# Patient Record
Sex: Female | Born: 2005 | Hispanic: No | Marital: Single
Health system: Southern US, Community
[De-identification: ages and names within clinical notes are randomized; demographics above are authoritative.]

## PROBLEM LIST (undated history)

## (undated) DIAGNOSIS — D649 Anemia, unspecified: Secondary | ICD-10-CM

## (undated) DIAGNOSIS — N12 Tubulo-interstitial nephritis, not specified as acute or chronic: Secondary | ICD-10-CM

## (undated) DIAGNOSIS — D369 Benign neoplasm, unspecified site: Secondary | ICD-10-CM

## (undated) DIAGNOSIS — N390 Urinary tract infection, site not specified: Secondary | ICD-10-CM

## (undated) DIAGNOSIS — T50902A Poisoning by unspecified drugs, medicaments and biological substances, intentional self-harm, initial encounter: Secondary | ICD-10-CM

## (undated) DIAGNOSIS — F319 Bipolar disorder, unspecified: Secondary | ICD-10-CM

## (undated) DIAGNOSIS — F32A Depression, unspecified: Secondary | ICD-10-CM

## (undated) DIAGNOSIS — F329 Major depressive disorder, single episode, unspecified: Secondary | ICD-10-CM

## (undated) HISTORY — DX: Benign neoplasm, unspecified site: D36.9

## (undated) HISTORY — DX: Tubulo-interstitial nephritis, not specified as acute or chronic: N12

## (undated) HISTORY — DX: Anemia, unspecified: D64.9

---
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
## 2023-03-04 HISTORY — PX: OTHER SURGICAL HISTORY: SHX169

## 2024-01-02 ENCOUNTER — Other Ambulatory Visit: Payer: Self-pay | Admitting: Adult Health

## 2024-01-02 DIAGNOSIS — Z3490 Encounter for supervision of normal pregnancy, unspecified, unspecified trimester: Secondary | ICD-10-CM

## 2024-01-02 DIAGNOSIS — O4690 Antepartum hemorrhage, unspecified, unspecified trimester: Secondary | ICD-10-CM

## 2024-01-07 LAB — BETA HCG QUANT (REF LAB): hCG Quant: 145481 m[IU]/mL

## 2024-01-09 ENCOUNTER — Ambulatory Visit: Payer: Self-pay | Admitting: Adult Health

## 2024-01-09 ENCOUNTER — Telehealth: Payer: Self-pay

## 2024-01-09 NOTE — Telephone Encounter (Signed)
 Pt aware blood test is saying she is about [redacted] weeks pregnant and needs a dating US . Pt states she went to [facility] over the weekend and tested positive for CHL. Pt was advised if she don't hear from them in the next couple of days, to let us  know. Pt voiced understanding. Call transferred to [facility] for appt. JSY

## 2024-01-09 NOTE — Telephone Encounter (Signed)
-----   Message from Delon Lewis sent at 01/09/2024  8:44 AM EDT ----- She needs dating US  ASAP

## 2024-01-09 NOTE — Telephone Encounter (Signed)
 Returned patient's call. States she was advised to take Keflex  for UTI but in the past that has not worked for her. Requesting injection for treatment.  Informed patient since we have never seen her in our office before, she will need to see a provider. Appt made for tomorrow at 3:30. Pt verbalized understanding with no further questions.

## 2024-01-09 NOTE — Telephone Encounter (Signed)
 Patient is early pregnant and is wanting to know if Kephlex is safe during pregnancy. Please advise.

## 2024-01-10 ENCOUNTER — Other Ambulatory Visit: Payer: Self-pay | Admitting: Obstetrics & Gynecology

## 2024-01-10 ENCOUNTER — Ambulatory Visit: Admitting: Obstetrics & Gynecology

## 2024-01-10 ENCOUNTER — Encounter: Payer: Self-pay | Admitting: Obstetrics & Gynecology

## 2024-01-10 VITALS — BP 120/75 | HR 78 | Ht 62.0 in | Wt 152.0 lb

## 2024-01-10 DIAGNOSIS — O219 Vomiting of pregnancy, unspecified: Secondary | ICD-10-CM | POA: Diagnosis not present

## 2024-01-10 DIAGNOSIS — Z3A09 9 weeks gestation of pregnancy: Secondary | ICD-10-CM

## 2024-01-10 DIAGNOSIS — O2341 Unspecified infection of urinary tract in pregnancy, first trimester: Secondary | ICD-10-CM | POA: Diagnosis not present

## 2024-01-10 DIAGNOSIS — O364XX0 Maternal care for intrauterine death, not applicable or unspecified: Secondary | ICD-10-CM | POA: Diagnosis not present

## 2024-01-10 DIAGNOSIS — N3000 Acute cystitis without hematuria: Secondary | ICD-10-CM

## 2024-01-10 DIAGNOSIS — Z3490 Encounter for supervision of normal pregnancy, unspecified, unspecified trimester: Secondary | ICD-10-CM

## 2024-01-10 DIAGNOSIS — O3680X0 Pregnancy with inconclusive fetal viability, not applicable or unspecified: Secondary | ICD-10-CM

## 2024-01-10 MED ORDER — AZITHROMYCIN 500 MG PO TABS: 1000.0000 mg | ORAL_TABLET | Freq: Once | ORAL | 0 refills | Status: AC

## 2024-01-10 MED ORDER — CEFTRIAXONE SODIUM 1 G IJ SOLR
1.0000 g | Freq: Once | INTRAMUSCULAR | Status: AC
Start: 2024-01-10 — End: 2024-01-10
  Administered 2024-01-10: 1 g via INTRAMUSCULAR

## 2024-01-10 NOTE — Progress Notes (Signed)
 Chief Complaint  Patient presents with   Urinary Tract Infection      18 y.o. [id number] No LMP recorded. Patient is pregnant. The current method of family planning is pregnanct.  Outpatient Encounter Medications as of 01/10/2024  Medication Sig   azithromycin (ZITHROMAX) 500 MG tablet Take 2 tablets (1,000 mg total) by mouth once for 1 dose.   [DISCONTINUED] FLUoxetine  (PROZAC ) 20 MG capsule Take 1 capsule (20 mg total) by mouth at bedtime.   [DISCONTINUED] fluticasone  (FLONASE ) 50 MCG/ACT nasal spray Place 2 sprays into both nostrils daily. (Patient taking differently: Place 2 sprays into both nostrils daily as needed for allergies. )   [DISCONTINUED] hydrOXYzine  (ATARAX /VISTARIL ) 50 MG tablet Take 1 tablet (50 mg total) by mouth at bedtime.   [DISCONTINUED] QUEtiapine  (SEROQUEL  XR) 50 MG TB24 24 hr tablet Take 2 tablets (100 mg total) by mouth at bedtime.   [EXPIRED] cefTRIAXone (ROCEPHIN) injection 1 g    No facility-administered encounter medications on file as of 01/10/2024.    Subjective Pt here for untreated UTI, I early pregnant has NOB scheduled in the future Also with untreated chlamydia infection   Past Medical History:  Diagnosis Date   Bipolar 1 disorder (HCC)    Depression    Dermoid cyst    Suicidal overdose (HCC)    UTI (urinary tract infection)     Past Surgical History:  Procedure Laterality Date   Left LAPAROSCOPY WITH FULGURATION OR EXCISION OF LESIONS OF THE OVARY, PELVIC VISCERA, OR PERITONEAL SURFACE Left 03/04/2023    OB History     Gravida  3   Para  1   Term      Preterm  1   AB  1   Living         SAB  1   IAB      Ectopic      Multiple      Live Births              Allergies  Allergen Reactions   [name]  [Lamotrigine ] Swelling   Bactrim [Sulfamethoxazole-Trimethoprim] Nausea And Vomiting   [name]  [Lurasidone  Hcl] Swelling    Of lips and face. Mom suspects this drug and declines using it.     Social  History   Socioeconomic History   Marital status: Single    Spouse name: Not on file   Number of children: Not on file   Years of education: Not on file   Highest education level: Not on file  Occupational History   Not on file  Tobacco Use   Smoking status: Passive Smoke Exposure - Never Smoker   Smokeless tobacco: Never  Vaping Use   Vaping status: Never Used  Substance and Sexual Activity   Alcohol use: No   Drug use: Yes    Types: Marijuana   Sexual activity: Yes    Birth control/protection: None    Comment: states she is currently sexually active with a female   Other Topics Concern   Not on file  Social History Narrative   Lives with Mom, step dad, younger brother, 1 cat   Social Drivers of Corporate Investment Banker Strain: Low Risk  (09/24/2023)   Received from [facility]   Overall Financial Resource Strain (CARDIA)    How hard is it for you to pay for the very basics like food, housing, medical care, and heating?: Not hard at all  Food Insecurity: No Food Insecurity (09/24/2023)  Received from [facility]   Hunger Vital Sign    Within the past 12 months, you worried that your food would run out before you got the money to buy more.: Never true    Within the past 12 months, the food you bought just didn't last and you didn't have money to get more.: Never true  Transportation Needs: No Transportation Needs (09/24/2023)   Received from [facility] - Transportation    Lack of Transportation (Medical): No    Lack of Transportation (Non-Medical): No  Physical Activity: Sufficiently Active (09/24/2023)   Received from [facility]   Exercise Vital Sign    On average, how many days per week do you engage in moderate to strenuous exercise (like a brisk walk)?: 4 days    On average, how many minutes do you engage in exercise at this level?: 120 min  Stress: Stress Concern Present (09/24/2023)   Received from [facility] - Occupational Stress Questionnaire    Do you feel stress - tense, restless, nervous, or anxious, or unable to sleep at night because your mind is troubled all the time - these days?: To some extent  Social Connections: Moderately Isolated (09/24/2023)   Received from [facility]   Social Connection and Isolation Panel    In a typical week, how many times do you talk on the phone with family, friends, or neighbors?: More than three times a week    How often do you get together with friends or relatives?: Once a week    How often do you attend church or religious services?: Never    Do you belong to any clubs or organizations such as church groups, unions, fraternal or athletic groups, or school groups?: No    How often do you attend meetings of the clubs or organizations you belong to?: Never    Are you married, widowed, divorced, separated, never married, or living with a partner?: Living with partner    History reviewed. No pertinent family history.  Medications:       Current Outpatient Medications:    azithromycin (ZITHROMAX) 500 MG tablet, Take 2 tablets (1,000 mg total) by mouth once for 1 dose., Disp: 2 tablet, Rfl: 0  Objective Blood pressure 120/75, pulse 78, height 5' 2 (1.575 m), weight 152 lb (68.9 kg).  IM rocephin 1 gram today Rx for 1 gram azithromycin  Pertinent ROS   Labs or studies     Impression + Management Plan: Diagnoses this Encounter::   ICD-10-CM   1. Early stage of pregnancy  Z34.90     2. IUFD at 20 weeks or more of gestation  O72.4XX0     3. Acute cystitis without hematuria  N30.00     4. Nausea/vomiting in pregnancy  O21.9         Medications prescribed during  this encounter: Meds ordered this encounter  Medications   cefTRIAXone (ROCEPHIN) injection 1 g    Antibiotic Indication::   UTI   azithromycin (ZITHROMAX) 500 MG tablet    Sig: Take 2 tablets (1,000 mg total) by mouth once for 1 dose.    Dispense:  2  tablet    Refill:  0    Labs or Scans Ordered during this encounter: No orders of the defined types were placed in this encounter.     Follow up Return in about 3 weeks (around 01/31/2024) for NOB appt.

## 2024-01-11 ENCOUNTER — Other Ambulatory Visit

## 2024-01-25 ENCOUNTER — Telehealth: Payer: Self-pay

## 2024-01-25 ENCOUNTER — Other Ambulatory Visit: Payer: Self-pay | Admitting: Women's Health

## 2024-01-25 DIAGNOSIS — A749 Chlamydial infection, unspecified: Secondary | ICD-10-CM | POA: Insufficient documentation

## 2024-01-25 MED ORDER — PROMETHAZINE HCL 25 MG PO TABS: 25.0000 mg | ORAL_TABLET | Freq: Four times a day (QID) | ORAL | 6 refills | Status: AC | PRN

## 2024-01-25 MED ORDER — AZITHROMYCIN 500 MG PO TABS: 1000.0000 mg | ORAL_TABLET | Freq: Once | ORAL | 0 refills | Status: AC

## 2024-01-25 NOTE — Telephone Encounter (Signed)
 Patient called regarding treatment for Chlamydia. Patient was seen in our office on 01/10/24 and was prescribed treatment for Chlamydia infection. Patient took the one dose medication and vomited it up not 30 mins after. Rn spoke with midwife and new prescription for treatment as well as nausea meds will be sent in for patient. Patient to take nausea meds and eat prior to taking treatment. Patient verbalized understanding.

## 2024-01-31 DIAGNOSIS — Z3490 Encounter for supervision of normal pregnancy, unspecified, unspecified trimester: Secondary | ICD-10-CM | POA: Insufficient documentation

## 2024-01-31 DIAGNOSIS — O0990 Supervision of high risk pregnancy, unspecified, unspecified trimester: Secondary | ICD-10-CM | POA: Insufficient documentation

## 2024-02-01 ENCOUNTER — Other Ambulatory Visit (HOSPITAL_COMMUNITY)
Admission: RE | Admit: 2024-02-01 | Discharge: 2024-02-01 | Disposition: A | Discharge status: Home / Self Care | Source: Ambulatory Visit | Attending: Women's Health | Admitting: Women's Health

## 2024-02-01 ENCOUNTER — Ambulatory Visit: Admitting: *Deleted

## 2024-02-01 ENCOUNTER — Encounter: Payer: Self-pay | Admitting: Women's Health

## 2024-02-01 ENCOUNTER — Ambulatory Visit (INDEPENDENT_AMBULATORY_CARE_PROVIDER_SITE_OTHER): Admitting: Women's Health

## 2024-02-01 VITALS — BP 111/73 | HR 72 | Wt 148.0 lb

## 2024-02-01 DIAGNOSIS — O98311 Other infections with a predominantly sexual mode of transmission complicating pregnancy, first trimester: Secondary | ICD-10-CM

## 2024-02-01 DIAGNOSIS — O2341 Unspecified infection of urinary tract in pregnancy, first trimester: Secondary | ICD-10-CM | POA: Diagnosis not present

## 2024-02-01 DIAGNOSIS — Z3A00 Weeks of gestation of pregnancy not specified: Secondary | ICD-10-CM | POA: Diagnosis not present

## 2024-02-01 DIAGNOSIS — O209 Hemorrhage in early pregnancy, unspecified: Secondary | ICD-10-CM | POA: Insufficient documentation

## 2024-02-01 DIAGNOSIS — Z8759 Personal history of other complications of pregnancy, childbirth and the puerperium: Secondary | ICD-10-CM

## 2024-02-01 DIAGNOSIS — A749 Chlamydial infection, unspecified: Secondary | ICD-10-CM

## 2024-02-01 DIAGNOSIS — O09299 Supervision of pregnancy with other poor reproductive or obstetric history, unspecified trimester: Secondary | ICD-10-CM | POA: Insufficient documentation

## 2024-02-01 DIAGNOSIS — Z1332 Encounter for screening for maternal depression: Secondary | ICD-10-CM | POA: Diagnosis not present

## 2024-02-01 DIAGNOSIS — Z3481 Encounter for supervision of other normal pregnancy, first trimester: Secondary | ICD-10-CM

## 2024-02-01 DIAGNOSIS — Z3480 Encounter for supervision of other normal pregnancy, unspecified trimester: Secondary | ICD-10-CM

## 2024-02-01 DIAGNOSIS — Z87440 Personal history of urinary (tract) infections: Secondary | ICD-10-CM

## 2024-02-01 DIAGNOSIS — O364XX0 Maternal care for intrauterine death, not applicable or unspecified: Secondary | ICD-10-CM

## 2024-02-01 DIAGNOSIS — Z720 Tobacco use: Secondary | ICD-10-CM

## 2024-02-01 DIAGNOSIS — Z3A12 12 weeks gestation of pregnancy: Secondary | ICD-10-CM

## 2024-02-01 DIAGNOSIS — Z131 Encounter for screening for diabetes mellitus: Secondary | ICD-10-CM

## 2024-02-01 DIAGNOSIS — O3441 Maternal care for other abnormalities of cervix, first trimester: Secondary | ICD-10-CM

## 2024-02-01 DIAGNOSIS — N841 Polyp of cervix uteri: Secondary | ICD-10-CM

## 2024-02-01 DIAGNOSIS — Z6829 Body mass index (BMI) 29.0-29.9, adult: Secondary | ICD-10-CM

## 2024-02-01 DIAGNOSIS — Z8632 Personal history of gestational diabetes: Secondary | ICD-10-CM

## 2024-02-01 MED ORDER — DOXYLAMINE-PYRIDOXINE 10-10 MG PO TBEC: DELAYED_RELEASE_TABLET | ORAL | 6 refills | Status: AC

## 2024-02-01 NOTE — Progress Notes (Signed)
 INITIAL OBSTETRICAL VISIT Patient name: Michelle Prince MRN [id number]  Date of birth: 2005-11-07 Chief Complaint:   Initial Prenatal Visit  History of Present Illness:   Michelle Prince is a 18 y.o. G71P0110 African-American female at [redacted]w[redacted]d by US  at 8 weeks with an Estimated Date of Delivery: 08/13/24 being seen today for her initial obstetrical visit.   No LMP recorded. Patient is pregnant. Her obstetrical history is significant for 36.3wk SVB/IUFD (short cord, A1DM, prominent intrahepatic vein seen on u/s, oligo, pyelo/sepsis earlier in pregnancy); then SAB x 1.   Recent UTI s/p IM Rocephin, no sx Recent +CT, took meds last week Nausea/vomiting, phenergan isn't helping much Almost daily vaginal bleeding, bright red. Denies abnormal discharge, itching/odor/irritation.   Last pap <21yo. Results were: N/A     02/01/2024    2:30 PM  Depression screen PHQ 2/9  Decreased Interest 1  Down, Depressed, Hopeless 1  PHQ - 2 Score 2  Altered sleeping 1  Tired, decreased energy 1  Change in appetite 1  Feeling bad or failure about yourself  1  Trouble concentrating 1  Moving slowly or fidgety/restless 0  Suicidal thoughts 0  PHQ-9 Score 7        02/01/2024    2:30 PM  GAD 7 : Generalized Anxiety Score  Nervous, Anxious, on Edge 2  Control/stop worrying 1  Worry too much - different things 2  Trouble relaxing 1  Restless 0  Easily annoyed or irritable 1  Afraid - awful might happen 2  Total GAD 7 Score 9     Review of Systems:   Pertinent items are noted in HPI Denies cramping/contractions, leakage of fluid, vaginal bleeding, abnormal vaginal discharge w/ itching/odor/irritation, headaches, visual changes, shortness of breath, chest pain, abdominal pain, severe nausea/vomiting, or problems with urination or bowel movements unless otherwise stated above.  Pertinent History Reviewed:  Reviewed past medical,surgical, social, obstetrical and family history.  Reviewed problem  list, medications and allergies. OB History  Gravida Para Term Preterm AB Living  3 1  1 1    SAB IAB Ectopic Multiple Live Births  1        # Outcome Date GA Lbr Len/2nd Weight Sex Type Anes PTL Lv  3 Current           2 SAB 02/2023          1 Preterm 10/11/22 [redacted]w[redacted]d / 00:14 5 lb 10.3 oz (2.56 kg) M Vag-Spont EPI N FD   Physical Assessment:   Vitals:   02/01/24 1418  BP: 111/73  Pulse: 72  Weight: 148 lb (67.1 kg)  Body mass index is 27.07 kg/m.       Physical Examination:  General appearance - well appearing, and in no distress  Mental status - alert, oriented to person, place, and time  Psych:  She has a normal mood and affect  Skin - warm and dry, normal color, no suspicious lesions noted  Chest - effort normal, all lung fields clear to auscultation bilaterally  Heart - normal rate and regular rhythm  Abdomen - soft, nontender  Extremities:  No swelling or varicosities noted  Pelvic - VULVA: normal appearing vulva with no masses, tenderness or lesions  VAGINA: normal appearing vagina with normal color, no lesions, creamy red d/c  CERVIX: 2 bleeding cervical polyps- monsels applied w/ good hemostasis, would remove today but no MD in office   Thin prep pap is not done   Chaperone: Winton Cherry  TODAY'S informal TA u/s: +FCA and active fetus  No results found for this or any previous visit (from the past 24 hours).  Assessment & Plan:  1) Low-Risk Pregnancy G3P0110 at [redacted]w[redacted]d with an Estimated Date of Delivery: 08/13/24   2) Initial OB visit  3) H/O 36wk IUFD> unclear etiology, short cord, A1DM, prominent intrahepatic vein seen on u/s, oligo, pyelo/sepsis earlier in pregnancy  4) H/O pyelo w/ sepsis, recent UTI> s/p rocephin IM, urine cx poc today, asymptomatic  5) H/O A1DM  6) Recent +CT> just finished meds last week  7) Vaginal bleeding> CV swab, expect CT to still be +  8) Cervical polyps> w/ bleeding, Monsels applied w/ good hemostasis. Did not remove today d/t no  MD in office in case of uncontrolled bleeding, will bring back for 1st available MD appt for removal. No sex. Reviewed warning s/s and reasons to seek care  9) THC use> advised complete cessation  10) Nausea> rx diclegis  11) Vapes> advised cessation  Meds: No orders of the defined types were placed in this encounter.   Initial labs obtained Continue prenatal vitamins Reviewed n/v relief measures and warning s/s to report Reviewed recommended weight gain based on pre-gravid BMI Encouraged well-balanced diet Genetic & carrier screening discussed: requests Panorama Ultrasound discussed; fetal survey: requested CCNC completed> form faxed if has or is planning to apply for medicaid The nature of [facility] [employer] with multiple MDs and other Advanced Practice Providers was explained to patient; also emphasized that fellows, residents, and students are part of our team. Does not have home bp cuff. Office bp cuff given: none available. Rx sent: no. Check bp weekly, let us  know if consistently >140/90.   Follow-up: Return in about 4 weeks (around 02/29/2024) for LROB, MD or CNM, in person; then @ 20w for anatomy u/s and LROB w/ CNM/MD.   Orders Placed This Encounter  Procedures   Urine Culture   CBC/D/Plt+RPR+Rh+ABO+RubIgG...   Hemoglobin A1c   PANORAMA PRENATAL TEST    Suzen JONELLE Fetters CNM, [facility] 02/01/2024 2:48 PM

## 2024-02-01 NOTE — Patient Instructions (Signed)
 Michelle Prince, thank you for choosing our office today! We appreciate the opportunity to meet your healthcare needs. You may receive a short survey by mail, e-mail, or through [employer]. If you are happy with your care we would appreciate if you could take just a few minutes to complete the survey questions. We read all of your comments and take your feedback very seriously. Thank you again for choosing our office.  Center for [employer] [facility] Team at [facility]  [facility] at [facility] ([address] [place], [place] [address]) Entrance C, located off of [address] Free 24/7 valet parking   Nausea & Vomiting Have saltine crackers or pretzels by your bed and eat a few bites before you raise your head out of bed in the morning Eat small frequent meals throughout the day instead of large meals Drink plenty of fluids throughout the day to stay hydrated, just don't drink a lot of fluids with your meals.  This can make your stomach fill up faster making you feel sick Do not brush your teeth right after you eat Products with real ginger are good for nausea, like ginger ale and ginger hard candy Make sure it says made with real ginger! Sucking on sour candy like lemon heads is also good for nausea If your prenatal vitamins make you nauseated, take them at night so you will sleep through the nausea Sea Bands If you feel like you need medicine for the nausea & vomiting please let us  know If you are unable to keep any fluids or food down please let us  know   Constipation Drink plenty of fluid, preferably water, throughout the day Eat foods high in fiber such as fruits, vegetables, and grains Exercise, such as walking, is a good way to keep your bowels regular Drink warm fluids, especially warm prune juice, or decaf coffee Eat a 1/2 cup of real oatmeal (not instant), 1/2 cup applesauce, and 1/2-1 cup warm prune juice every day If needed, you may take Colace (docusate sodium) stool softener  once or twice a day to help keep the stool soft.  If you still are having problems with constipation, you may take Miralax  once daily as needed to help keep your bowels regular.   Home Blood Pressure Monitoring for Patients   Your provider has recommended that you check your blood pressure (BP) at least once a week at home. If you do not have a blood pressure cuff at home, one will be provided for you. Contact your provider if you have not received your monitor within 1 week.   Helpful Tips for Accurate Home Blood Pressure Checks  Don't smoke, exercise, or drink caffeine 30 minutes before checking your BP Use the restroom before checking your BP (a full bladder can raise your pressure) Relax in a comfortable upright chair Feet on the ground Left arm resting comfortably on a flat surface at the level of your heart Legs uncrossed Back supported Sit quietly and don't talk Place the cuff on your bare arm Adjust snuggly, so that only two fingertips can fit between your skin and the top of the cuff Check 2 readings separated by at least one minute Keep a log of your BP readings For a visual, please reference this diagram: http://ccnc.care/bpdiagram  Provider Name: [facility]     Phone: [phone]  Zone 1: ALL CLEAR  Continue to monitor your symptoms:  BP reading is less than 140 (top number) or less than 90 (bottom  number)  No right upper stomach pain No headaches or seeing spots No feeling nauseated or throwing up No swelling in face and hands  Zone 2: CAUTION Call your doctor's office for any of the following:  BP reading is greater than 140 (top number) or greater than 90 (bottom number)  Stomach pain under your ribs in the middle or right side Headaches or seeing spots Feeling nauseated or throwing up Swelling in face and hands  Zone 3: EMERGENCY  Seek immediate medical care if you have any of the following:  BP reading is greater than160 (top number) or greater than  110 (bottom number) Severe headaches not improving with Tylenol  Serious difficulty catching your breath Any worsening symptoms from Zone 2    First Trimester of Pregnancy The first trimester of pregnancy is from week 1 until the end of week 12 (months 1 through 3). A week after a sperm fertilizes an egg, the egg will implant on the wall of the uterus. This embryo will begin to develop into a baby. Genes from you and your partner are forming the baby. The female genes determine whether the baby is a boy or a girl. At 6-8 weeks, the eyes and face are formed, and the heartbeat can be seen on ultrasound. At the end of 12 weeks, all the baby's organs are formed.  Now that you are pregnant, you will want to do everything you can to have a healthy baby. Two of the most important things are to get good prenatal care and to follow your health care provider's instructions. Prenatal care is all the medical care you receive before the baby's birth. This care will help prevent, find, and treat any problems during the pregnancy and childbirth. BODY CHANGES Your body goes through many changes during pregnancy. The changes vary from woman to woman.  You may gain or lose a couple of pounds at first. You may feel sick to your stomach (nauseous) and throw up (vomit). If the vomiting is uncontrollable, call your health care provider. You may tire easily. You may develop headaches that can be relieved by medicines approved by your health care provider. You may urinate more often. Painful urination may mean you have a bladder infection. You may develop heartburn as a result of your pregnancy. You may develop constipation because certain hormones are causing the muscles that push waste through your intestines to slow down. You may develop hemorrhoids or swollen, bulging veins (varicose veins). Your breasts may begin to grow larger and become tender. Your nipples may stick out more, and the tissue that surrounds them  (areola) may become darker. Your gums may bleed and may be sensitive to brushing and flossing. Dark spots or blotches (chloasma, mask of pregnancy) may develop on your face. This will likely fade after the baby is born. Your menstrual periods will stop. You may have a loss of appetite. You may develop cravings for certain kinds of food. You may have changes in your emotions from day to day, such as being excited to be pregnant or being concerned that something may go wrong with the pregnancy and baby. You may have more vivid and strange dreams. You may have changes in your hair. These can include thickening of your hair, rapid growth, and changes in texture. Some women also have hair loss during or after pregnancy, or hair that feels dry or thin. Your hair will most likely return to normal after your baby is born. WHAT TO EXPECT AT YOUR PRENATAL  VISITS During a routine prenatal visit: You will be weighed to make sure you and the baby are growing normally. Your blood pressure will be taken. Your abdomen will be measured to track your baby's growth. The fetal heartbeat will be listened to starting around week 10 or 12 of your pregnancy. Test results from any previous visits will be discussed. Your health care provider may ask you: How you are feeling. If you are feeling the baby move. If you have had any abnormal symptoms, such as leaking fluid, bleeding, severe headaches, or abdominal cramping. If you have any questions. Other tests that may be performed during your first trimester include: Blood tests to find your blood type and to check for the presence of any previous infections. They will also be used to check for low iron levels (anemia) and Rh antibodies. Later in the pregnancy, blood tests for diabetes will be done along with other tests if problems develop. Urine tests to check for infections, diabetes, or protein in the urine. An ultrasound to confirm the proper growth and development  of the baby. An amniocentesis to check for possible genetic problems. Fetal screens for spina bifida and Down syndrome. You may need other tests to make sure you and the baby are doing well. HOME CARE INSTRUCTIONS  Medicines Follow your health care provider's instructions regarding medicine use. Specific medicines may be either safe or unsafe to take during pregnancy. Take your prenatal vitamins as directed. If you develop constipation, try taking a stool softener if your health care provider approves. Diet Eat regular, well-balanced meals. Choose a variety of foods, such as meat or vegetable-based protein, fish, milk and low-fat dairy products, vegetables, fruits, and whole grain breads and cereals. Your health care provider will help you determine the amount of weight gain that is right for you. Avoid raw meat and uncooked cheese. These carry germs that can cause birth defects in the baby. Eating four or five small meals rather than three large meals a day may help relieve nausea and vomiting. If you start to feel nauseous, eating a few soda crackers can be helpful. Drinking liquids between meals instead of during meals also seems to help nausea and vomiting. If you develop constipation, eat more high-fiber foods, such as fresh vegetables or fruit and whole grains. Drink enough fluids to keep your urine clear or pale yellow. Activity and Exercise Exercise only as directed by your health care provider. Exercising will help you: Control your weight. Stay in shape. Be prepared for labor and delivery. Experiencing pain or cramping in the lower abdomen or low back is a good sign that you should stop exercising. Check with your health care provider before continuing normal exercises. Try to avoid standing for long periods of time. Move your legs often if you must stand in one place for a long time. Avoid heavy lifting. Wear low-heeled shoes, and practice good posture. You may continue to have sex  unless your health care provider directs you otherwise. Relief of Pain or Discomfort Wear a good support bra for breast tenderness.   Take warm sitz baths to soothe any pain or discomfort caused by hemorrhoids. Use hemorrhoid cream if your health care provider approves.   Rest with your legs elevated if you have leg cramps or low back pain. If you develop varicose veins in your legs, wear support hose. Elevate your feet for 15 minutes, 3-4 times a day. Limit salt in your diet. Prenatal Care Schedule your prenatal visits by the  twelfth week of pregnancy. They are usually scheduled monthly at first, then more often in the last 2 months before delivery. Write down your questions. Take them to your prenatal visits. Keep all your prenatal visits as directed by your health care provider. Safety Wear your seat belt at all times when driving. Make a list of emergency phone numbers, including numbers for family, friends, the hospital, and police and fire departments. General Tips Ask your health care provider for a referral to a local prenatal education class. Begin classes no later than at the beginning of month 6 of your pregnancy. Ask for help if you have counseling or nutritional needs during pregnancy. Your health care provider can offer advice or refer you to specialists for help with various needs. Do not use hot tubs, steam rooms, or saunas. Do not douche or use tampons or scented sanitary pads. Do not cross your legs for long periods of time. Avoid cat litter boxes and soil used by cats. These carry germs that can cause birth defects in the baby and possibly loss of the fetus by miscarriage or stillbirth. Avoid all smoking, herbs, alcohol, and medicines not prescribed by your health care provider. Chemicals in these affect the formation and growth of the baby. Schedule a dentist appointment. At home, brush your teeth with a soft toothbrush and be gentle when you floss. SEEK MEDICAL CARE IF:   You have dizziness. You have mild pelvic cramps, pelvic pressure, or nagging pain in the abdominal area. You have persistent nausea, vomiting, or diarrhea. You have a bad smelling vaginal discharge. You have pain with urination. You notice increased swelling in your face, hands, legs, or ankles. SEEK IMMEDIATE MEDICAL CARE IF:  You have a fever. You are leaking fluid from your vagina. You have spotting or bleeding from your vagina. You have severe abdominal cramping or pain. You have rapid weight gain or loss. You vomit blood or material that looks like coffee grounds. You are exposed to German measles and have never had them. You are exposed to fifth disease or chickenpox. You develop a severe headache. You have shortness of breath. You have any kind of trauma, such as from a fall or a car accident. Document Released: 03/02/2001 Document Revised: 07/23/2013 Document Reviewed: 01/16/2013 [facility] Patient Information 2015 [place], [place]. This information is not intended to replace advice given to you by your health care provider. Make sure you discuss any questions you have with your health care provider.

## 2024-02-02 ENCOUNTER — Encounter: Payer: Self-pay | Admitting: Women's Health

## 2024-02-02 DIAGNOSIS — Z3490 Encounter for supervision of normal pregnancy, unspecified, unspecified trimester: Secondary | ICD-10-CM | POA: Insufficient documentation

## 2024-02-02 LAB — CBC/D/PLT+RPR+RH+ABO+RUBIGG...
Antibody Screen: NEGATIVE
Basophils Absolute: 0 x10E3/uL (ref 0.0–0.2)
Basos: 0 %
EOS (ABSOLUTE): 0 x10E3/uL (ref 0.0–0.4)
Eos: 0 %
HCV Ab: NONREACTIVE
HIV Screen 4th Generation wRfx: NONREACTIVE
Hematocrit: 38.8 % (ref 34.0–46.6)
Hemoglobin: 13 g/dL (ref 11.1–15.9)
Hepatitis B Surface Ag: NEGATIVE
Immature Grans (Abs): 0 x10E3/uL (ref 0.0–0.1)
Immature Granulocytes: 0 %
Lymphocytes Absolute: 1.1 x10E3/uL (ref 0.7–3.1)
Lymphs: 12 %
MCH: 30.7 pg (ref 26.6–33.0)
MCHC: 33.5 g/dL (ref 31.5–35.7)
MCV: 92 fL (ref 79–97)
Monocytes Absolute: 0.4 x10E3/uL (ref 0.1–0.9)
Monocytes: 4 %
Neutrophils Absolute: 7.7 x10E3/uL — ABNORMAL HIGH (ref 1.4–7.0)
Neutrophils: 84 %
Platelets: 354 x10E3/uL (ref 150–450)
RBC: 4.24 x10E6/uL (ref 3.77–5.28)
RDW: 13.9 % (ref 11.7–15.4)
RPR Ser Ql: NONREACTIVE
Rh Factor: POSITIVE
Rubella Antibodies, IGG: <0.9 {index} — ABNORMAL LOW (ref >0.99)
WBC: 9.3 x10E3/uL (ref 3.4–10.8)

## 2024-02-02 LAB — HEMOGLOBIN A1C
Est. average glucose Bld gHb Est-mCnc: 94 mg/dL
Hgb A1c MFr Bld: 4.9 % (ref 4.8–5.6)

## 2024-02-02 LAB — HCV INTERPRETATION

## 2024-02-03 LAB — CERVICOVAGINAL ANCILLARY ONLY
Bacterial Vaginitis (gardnerella): NEGATIVE
Candida Glabrata: NEGATIVE
Candida Vaginitis: NEGATIVE
Chlamydia: NEGATIVE
Comment: NEGATIVE
Comment: NEGATIVE
Comment: NEGATIVE
Comment: NEGATIVE
Comment: NEGATIVE
Comment: NORMAL
Neisseria Gonorrhea: NEGATIVE
Trichomonas: NEGATIVE

## 2024-02-03 LAB — URINE CULTURE

## 2024-02-06 ENCOUNTER — Ambulatory Visit: Payer: Self-pay | Admitting: Advanced Practice Midwife

## 2024-02-08 LAB — PANORAMA PRENATAL TEST FULL PANEL:PANORAMA TEST PLUS 5 ADDITIONAL MICRODELETIONS: FETAL FRACTION: 10.2

## 2024-02-23 ENCOUNTER — Encounter: Payer: Self-pay | Admitting: Obstetrics & Gynecology

## 2024-02-23 ENCOUNTER — Other Ambulatory Visit (HOSPITAL_COMMUNITY)
Admission: RE | Admit: 2024-02-23 | Discharge: 2024-02-23 | Disposition: A | Source: Ambulatory Visit | Attending: Obstetrics & Gynecology | Admitting: Obstetrics & Gynecology

## 2024-02-23 ENCOUNTER — Ambulatory Visit: Admitting: Obstetrics & Gynecology

## 2024-02-23 VITALS — BP 119/73 | HR 100 | Ht 63.0 in | Wt 140.4 lb

## 2024-02-23 DIAGNOSIS — Z8619 Personal history of other infectious and parasitic diseases: Secondary | ICD-10-CM | POA: Diagnosis present

## 2024-02-23 DIAGNOSIS — O4692 Antepartum hemorrhage, unspecified, second trimester: Secondary | ICD-10-CM | POA: Insufficient documentation

## 2024-02-23 DIAGNOSIS — N841 Polyp of cervix uteri: Secondary | ICD-10-CM

## 2024-02-23 DIAGNOSIS — Z3482 Encounter for supervision of other normal pregnancy, second trimester: Secondary | ICD-10-CM

## 2024-02-23 DIAGNOSIS — O219 Vomiting of pregnancy, unspecified: Secondary | ICD-10-CM

## 2024-02-23 DIAGNOSIS — F419 Anxiety disorder, unspecified: Secondary | ICD-10-CM

## 2024-02-23 DIAGNOSIS — F332 Major depressive disorder, recurrent severe without psychotic features: Secondary | ICD-10-CM

## 2024-02-23 LAB — CERVICOVAGINAL ANCILLARY ONLY
Chlamydia: NEGATIVE
Comment: NEGATIVE
Comment: NORMAL
Neisseria Gonorrhea: NEGATIVE

## 2024-02-23 MED ORDER — ESCITALOPRAM OXALATE 10 MG PO TABS: 10.0000 mg | ORAL_TABLET | Freq: Every day | ORAL | 4 refills | Status: AC

## 2024-02-23 MED ORDER — PRENATAL VITAMIN 27-0.8 MG PO TABS: 1.0000 | ORAL_TABLET | Freq: Every day | ORAL | 4 refills | Status: AC

## 2024-02-23 MED ORDER — HYDROXYZINE HCL 10 MG PO TABS: 10.0000 mg | ORAL_TABLET | Freq: Three times a day (TID) | ORAL | 0 refills | Status: DC | PRN

## 2024-02-23 NOTE — Progress Notes (Signed)
 HIGH-RISK PREGNANCY VISIT Patient name: Michelle Prince MRN [id number]  Date of birth: [birth year] Chief Complaint:   No chief complaint on file.  History of Present Illness:   Michelle Prince is a 18 y.o. G48P0110 female at [redacted]w[redacted]d with an Estimated Date of Delivery: 08/13/24 being seen today for ongoing management of a high-risk pregnancy complicated by   -Cervical polyp: Sometime pink to bright red, not wearing a pad.  On occasion may see some spotting, but only a small amount.  -Chlamdyia- both she and partner have been treated  -Notes concerns about mental health- Notes feeling sad, lack Feeling of hopelessness.  Concerns regarding anxiety/depression- previously taking lexapro, hydroxyzine , seroquel .    -Nausea/vomiting- has noted some weight loss, taking Phenergan  which is helping and feels like she is on a better regimen now  Denies vaginal bleeding, no yet feeling fetal movement.  No contractions.  Denies leaking of fluid.      02/01/2024    2:30 PM  Depression screen PHQ 2/9  Decreased Interest 1  Down, Depressed, Hopeless 1  PHQ - 2 Score 2  Altered sleeping 1  Tired, decreased energy 1  Change in appetite 1  Feeling bad or failure about yourself  1  Trouble concentrating 1  Moving slowly or fidgety/restless 0  Suicidal thoughts 0  PHQ-9 Score 7     Current Outpatient Medications  Medication Instructions   Doxylamine -Pyridoxine  (DICLEGIS ) 10-10 MG TBEC 2 tabs q hs, if sx persist add 1 tab q am on day 3, if sx persist add 1 tab q afternoon on day 4   escitalopram (LEXAPRO) 10 mg, Oral, Daily   hydrOXYzine  (ATARAX ) 10 mg, Oral, 3 times daily PRN   promethazine  (PHENERGAN ) 25 mg, Oral, Every 6 hours PRN     Review of Systems:   Pertinent items are noted in HPI Denies abnormal vaginal discharge w/ itching/odor/irritation, headaches, visual changes, shortness of breath, chest pain, abdominal pain, severe nausea/vomiting, or problems with urination or bowel movements  unless otherwise stated above. Pertinent History Reviewed:  Reviewed past medical,surgical, social, obstetrical and family history.  Reviewed problem list, medications and allergies. Physical Assessment:   Vitals:   02/23/24 1056  BP: 119/73  Pulse: 100  Weight: 140 lb 6.4 oz (63.7 kg)  Height: 5' 3 (1.6 m)  Body mass index is 24.87 kg/m.           Physical Examination:   General appearance: alert, well appearing, and in no distress  Mental status: normal mood, behavior, speech, dress, motor activity, and thought processes  Skin: warm & dry   Extremities:      Cardiovascular: normal heart rate noted  Respiratory: normal respiratory effort, no distress  Abdomen: gravid, soft, non-tender  Pelvic: normal external genitalia, vaginal pink mucosa,  no abnormal discharge or lesions.  Cervix appears friable and polypoid-like- no bleeding noted.          Fetal Status:        150bpm by doppler  Fetal Surveillance Testing today: doppler   Chaperone: Alan Fischer    No results found for this or any previous visit (from the past 24 hours).   Assessment & Plan:  High-risk pregnancy: G3P0110 at [redacted]w[redacted]d with an Estimated Date of Delivery: 08/13/24   1) Cervical polyp/vaginal spotting - Reassured patient that spotting is likely due to cervix.  Discussed that light spotting may continue throughout pregnancy - No intervention indicated at this time - Should she note worsening bleeding would advise  patient call but otherwise we will continue monitoring  2) Anxiety and depression - Plan to restart Lexapro and hydroxyzine  - Will continue to monitor  3) history of chlamydia, POC collected today  4) nausea/vomiting - Plan to continue with Phenergan  - 8 pound weight loss since prior visit, will continue to monitor - Patient given precautions to call if worsening of symptoms or unable to keep anything down  5) Prenatal care - Patient does not have BP cuff and we did not have any in office  to give to her - Encouraged patient to try and get one on her own  Meds:  Meds ordered this encounter  Medications   escitalopram (LEXAPRO) 10 MG tablet    Sig: Take 1 tablet (10 mg total) by mouth daily.    Dispense:  90 tablet    Refill:  4   hydrOXYzine  (ATARAX ) 10 MG tablet    Sig: Take 1 tablet (10 mg total) by mouth 3 (three) times daily as needed for anxiety.    Dispense:  30 tablet    Refill:  0    Labs/procedures today: GC/C  Treatment Plan:  []  Anatomy scan and AFP at next visit  Reviewed: Preterm labor symptoms and general obstetric precautions including but not limited to vaginal bleeding, contractions, leaking of fluid and fetal movement were reviewed in detail with the patient.  All questions were answered.   Follow-up: Return for cancel 12/10, keep other appointment.   Future Appointments  Date Time Provider Department Center  03/27/2024 10:00 AM [facility]  [facility] None  03/27/2024 10:50 AM Kizzie Suzen SAUNDERS, CNM [facility]    No orders of the defined types were placed in this encounter.   Jamarques Pinedo, DO Attending Obstetrician & Gynecologist, [facility] for [employer], [facility]

## 2024-02-24 ENCOUNTER — Ambulatory Visit: Payer: Self-pay | Admitting: Obstetrics & Gynecology

## 2024-02-29 ENCOUNTER — Encounter: Admitting: Advanced Practice Midwife

## 2024-03-02 NOTE — H&P (Signed)
 HISTORY & PHYSICAL [facility]     Assessment & Plan   Michelle Prince is a 18 y.o. G2P0100 here with c/o leakage of fluid with concern this it is  not  urine  Principal Problem:   Hypokalemia Active Problems:   Leakage of amniotic fluid (HHS-HCC)   Plan:  Patient with confirmed hypokalemia and questionable UTI. Patient with history of UTIs in last pregnancy. Plan to admit for observation and will replace potassium  Dispo: Likely to discharge later today pending electrolyte replacement.     History   Chief Complaint:   Chief Complaint  Patient presents with   Abdominal Pain - Pregnancy    Leakage from pregnancy, no pain     HPI:  Michelle Prince is a 18 y.o. G2P0100 here with c/o leakage of fluid that she is concerned is not urine. She has a pregnancy complicated by fetal demise at 36 weeks and is very worried this pregnancy  PMH is complicated by:  Problem List[1]  The patient's records and other relevant notes/imaging/labs from the chart were reviewed: yes Interpreter Services: no   ROS ROS completed and is otherwise negative except as listed above in the HPI.   Past Medical History   Home Medications Medications Ordered Prior to Encounter[2]  Allergies Allergies[3]  OB History  Gravida Para Term Preterm AB Living  2 1  1     SAB IAB Ectopic Molar Multiple Live Births      0     # Outcome Date GA Lbr Len/2nd Weight Sex Type Anes PTL Lv  2 Current           1 Preterm 10/11/22 [redacted]w[redacted]d / 00:14 2560 g (5 lb 10.3 oz) M Vag-Spont IV Analgesic, EPI N FD    Past Medical History:  Diagnosis Date   Anxiety    Depression    Gestational diabetes (HHS-HCC)     Past Surgical History:  Procedure Laterality Date   OVARIAN CYST REMOVAL Left 03/04/2023    Social History   Substance and Sexual Activity  Alcohol Use Never   Social History   Tobacco Use  Smoking Status Some Days   Types: e-Cigarettes  Smokeless Tobacco Never     Family History  Problem Relation Age of Onset   Bipolar disorder Mother    Anxiety disorder Mother    No Known Problems Father    Asthma Brother     Physical Exam   BP 110/65   Pulse 69   Temp 35.7 C (96.3 F) (Temporal)   Resp 20   Ht 160 cm (5' 3)   Wt 64 kg (141 lb)   LMP 08/25/2023 (Exact Date)   SpO2 99%   BMI 24.98 kg/m   Physical Exam Gen: well developed and well- nourished, alert and oriented, no acute distress Skin: No lesions or rashes Head: NCAT Abd: Soft, nontender, nondistended, normal bowel sounds, good fetal heart tones of 150s Pelvic Exam: Deferred Extremitites: No edema Neuro: unremarkable; CN 2-12 grossly intact Psych: Normal affect   Results   Labs, imaging reviewed per chart.   Labs Recent Results (from the past 24 hours)  hCG QUANTitative, Blood   Collection Time: 03/01/24 11:28 PM  Result Value Ref Range   hCG Quantitative 46,175.0 mIU/mL  Comprehensive Metabolic Panel   Collection Time: 03/01/24 11:28 PM  Result Value Ref Range   Sodium 138 135 - 145 mmol/L   Potassium 2.7 (L) 3.5 - 5.0 mmol/L   Chloride  104 98 - 107 mmol/L   CO2 25.4 21.0 - 32.0 mmol/L   Anion Gap 9 3 - 11 mmol/L   BUN 4 (L) 8 - 20 mg/dL   Creatinine 9.48 (L) 9.39 - 1.10 mg/dL   BUN/Creatinine Ratio 8    eGFR CKD-EPI (2021) Female >90 >=60 mL/min/1.2m2   Glucose 107 70 - 179 mg/dL   Calcium 8.9 8.5 - 89.8 mg/dL   Albumin 2.7 (L) 3.5 - 5.0 g/dL   Total Protein 6.9 6.0 - 8.0 g/dL   Total Bilirubin 0.3 0.3 - 1.2 mg/dL   AST 10 (L) 15 - 40 U/L   ALT 15 12 - 78 U/L   Alkaline Phosphatase 59 46 - 116 U/L  CBC w/ Differential   Collection Time: 03/01/24 11:28 PM  Result Value Ref Range   WBC 7.8 4.0 - 10.5 10*9/L   RBC 3.44 (L) 3.80 - 5.10 10*12/L   HGB 10.9 (L) 11.5 - 15.0 g/dL   HCT 70.1 (L) 65.9 - 55.9 %   MCV 86.6 80.0 - 98.0 fL   MCH 31.7 27.0 - 34.0 pg   MCHC 36.6 (H) 32.0 - 36.0 g/dL   RDW 85.7 88.4 - 85.4 %   MPV 9.2 7.4 - 10.4 fL    Platelet 287 140 - 415 10*9/L   Neutrophils % 64.1 %   Lymphocytes % 25.3 %   Monocytes % 9.0 %   Eosinophils % 0.9 %   Basophils % 0.4 %   Absolute Neutrophils 5.0 1.8 - 7.8 10*9/L   Absolute Lymphocytes 2.0 0.7 - 4.5 10*9/L   Absolute Monocytes 0.7 0.1 - 1.0 10*9/L   Absolute Eosinophils 0.1 0.0 - 0.4 10*9/L   Absolute Basophils 0.0 0.0 - 0.2 10*9/L  Type and Screen with Confirmation ABORh   Collection Time: 03/02/24 12:41 AM  Result Value Ref Range   Blood Type O POS    Antibody Screen NEG   Urinalysis with Microscopy with Culture Reflex   Collection Time: 03/02/24  2:07 AM  Result Value Ref Range   Color, UA Yellow    Clarity, UA Cloudy (A) Clear   Specific Gravity, UA 1.019 1.010 - 1.025   pH, UA 6.5 5.0 - 8.0   Leukocyte Esterase, UA Large (A) Negative   Nitrite, UA Negative Negative   Protein, UA 30 mg/dL (A) Negative   Glucose, UA Negative Negative, Trace   Ketones, UA Negative Negative   Urobilinogen, UA <2.0 mg/dL <7.9 mg/dL   Bilirubin, UA Negative Negative   Blood, UA Trace (A) Negative   RBC, UA 17 (H) 0 - 3 /HPF   WBC, UA 92 (H) 0 - 3 /HPF   Squam Epithel, UA 23 (H) 0 - 10 /HPF   Bacteria, UA Occasional (A) None Seen /HPF   WBC Clumps None Seen None Seen /HPF   Hyphal Yeast None Seen None Seen /HPF   Sperm, UA Present /HPF   Yeast, UA Moderate (A) None Seen /HPF  Vaginitis Molecular Panel   Collection Time: 03/02/24  2:13 AM   Specimen: Patient-collected Vaginal Swab  Result Value Ref Range   Bacterial Vaginitis Positive (A) Negative   Candida group NAAT Detected (A) Not Detected   Candida glabrata/Candida krusei NAAT Detected (A) Not Detected   Trichomonas NAAT Not Detected Not Detected       Electronically signed by:  Dionne P Galloway, MD         [1] Patient Active Problem List Diagnosis   BMI (body  mass index), pediatric, 5% to less than 85% for age   Sexually active at young age   Engages in vaping   Acute pyelonephritis    Hyperemesis of pregnancy (HHS-HCC)   Hypokalemia   Hyponatremia   Sepsis due to urinary tract infection (CMS-HCC)   Leakage of amniotic fluid (HHS-HCC)  [2] No current facility-administered medications on file prior to encounter.   Current Outpatient Medications on File Prior to Encounter  Medication Sig Dispense Refill   escitalopram  oxalate (LEXAPRO ) 10 MG tablet Take 1 tablet (10 mg total) by mouth daily. TAKE 1 TABLET (10 MG TOTAL) BY MOUTH DAILY.     hydrOXYzine  (ATARAX ) 10 MG tablet Take 1 tablet (10 mg total) by mouth in the morning.     PRENATAL VITAMIN 27 mg iron- 0.8 mg Tab tablet Take 1 tablet by mouth daily.     promethazine  (PHENERGAN ) 25 MG tablet Take 1 tablet (25 mg total) by mouth every six (6) hours as needed for nausea.     ferrous sulfate 325 (65 FE) MG EC tablet Take 1 tablet (325 mg total) by mouth Three (3) times a day with a meal. (Patient not taking: Reported on 10/29/2022)     ibuprofen  (MOTRIN ) 800 MG tablet Take 1 tablet (800 mg total) by mouth every eight (8) hours as needed. (Patient not taking: Reported on 10/29/2022) 30 tablet 0   multivitamin, prenatal, folic acid-iron, 29 mg iron- 1 mg Take 1 tablet by mouth daily. (Patient not taking: Reported on 10/29/2022) 30 tablet 6  [3] Allergies Allergen Reactions   Lamotrigine  Rash and Swelling   Lurasidone  Hcl Swelling    Of lips and face. Mom suspects this drug and declines using it.   Sulfa (Sulfonamide Antibiotics) Other (See Comments)    Unsure of reaction    Sulfamethoxazole    Trimethoprim    Bactrim [Sulfamethoxazole-Trimethoprim] Nausea And Vomiting   [name]  [Lurasidone ] Rash

## 2024-03-02 NOTE — Progress Notes (Addendum)
 ------------------------------------------------------------------------------- Summary: [facility] OBV -------------------------------------------------------------------------------  History and Physical Assessment/Plan:  Michelle Prince is a 18 y.o. G2P0100 at [redacted]w[redacted]d Estimated Date of Delivery: 08/13/24 who is being admitted for evaluation for leaking of fluid, hypokalemia management, and UTI management.  Principal Problem:   Hypokalemia Active Problems:   Leakage of amniotic fluid (HHS-HCC)   -UTI: Culture sent. Patient given Rocephin  IV in ER. Plan for Keflex  P.O. upon discharge -Cultures + for BV and yeast. Miconazole vaginal and Flagyl rx sent in -Hypokalemia: Given PO Kcl in ER which induced emesis. S/P 20meq IV x 1. Ordered additional 40meq IV and plan to recheck Potassium serum levels 2 hours after last dose has completed.  -Concern for LOF: SSE and amnisure negative. Limited US  ordered to evaluate fetal lie, placental position, and AFI for gestational age. History of Chlamydia in this pregnancy. G/C ordered and pending. Dispo: Stable. Plan to re-evaluate pending above meausres.  History of Present Illness:  Chief Complaint - leaking of fluid  Patient presented with complaint of leaking of fluid. Prior pregnancy resulted in IUFD and patient noted that there was no amniotic fluid noted on US  when IUFD was diagnosed. Notes that her underwear has been very wet and that she does not believe this is urine. Denies contractions, vaginal bleeding. Denies recent intercourse. Denies fevers, chills,nausea, chest pain shortness of breath.Had one episode of emesis after taking the potassium pills in the ER. Denies any other signs or symptoms.   Allergies  Lamotrigine , Lurasidone  hcl, Sulfa (sulfonamide antibiotics), Sulfamethoxazole, Trimethoprim, Bactrim [sulfamethoxazole-trimethoprim], and [name]  [lurasidone ]  Medications    Current Medications[1]   OB History  OB History   Gravida Para Term Preterm AB Living  2 1 0 1 0 0  SAB IAB Ectopic Molar Multiple Live Births  0 0 0 0 0 0    # Outcome Date GA Lbr Len/2nd Weight Sex Type Anes PTL Lv  2 Current           1 Preterm 10/11/22 [redacted]w[redacted]d / 00:14 2560 g (5 lb 10.3 oz) M Vag-Spont IV Analgesic, EPI N FD     Name: Michelle Prince DON Hedges     Apgar1: 0  Apgar5: 0      Past Medical History  Past Medical History[2]  Past Surgical History  Past Surgical History[3]  Past Social History:  Social History[4]   Family History  family history includes Anxiety disorder in her mother; Asthma in her brother; Bipolar disorder in her mother; No Known Problems in her father.   Review of Systems  A 12 system review of systems was negative except as noted in HPI   Vital Signs  Temp:  [35.7 C (96.3 F)] 35.7 C (96.3 F) Pulse:  [69-75] 75 Resp:  [16-20] 16 BP: (110-114)/(65-75) 114/75 MAP (mmHg):  [79] 79 SpO2:  [99 %-100 %] 100 % BMI (Calculated):  [24.98] 24.98 No intake/output data recorded.   Physical Exam   General Appearance No acute distress, well appearing and well nourished.  Eyes Pupils equal and round. Extraocular muscles intact and sclera clear.  ENT Well hydrated moist mucous membranes        Pulmonary Clear to auscultation bilaterally, without wheezes/crackles/rhonchi. Good air movement.  Cardiovascular Pulse normal rate, regularity and rhythm. S1 and S2 normal, 2/6 systolic murmur.  Abdomen Normoactive bowel sounds, abdomen soft, gravid, no fundal tenderness, no rebound or guarding.     Genitourinary normal external genitalia, vulva, vagina, cervix, uterus and adnexa. Thick green clumpy discharge noted in  the vaginal vault. No pooling. Negative leaking of fluid from cervix with valsalva.No bleeding.  SVE 0 , 30 , -3   Extremities No rash, lesions or petechiae. No bilateral cyanosis, clubbing.  Edema none  Skin Skin color, texture, turgor normal, no rash/lesions/breakdown   Neurologic Alert and oriented to person, place, and time. Cranial nerves II-XII grossly intact, normal gait. DTR 2+  Psychiatric Mood and affect within normal limits     Test Results  Lab results last 24 hours:  Recent Results (from the past 24 hours)  hCG QUANTitative, Blood   Collection Time: 03/01/24 11:28 PM  Result Value Ref Range   hCG Quantitative 46,175.0 mIU/mL  Comprehensive Metabolic Panel   Collection Time: 03/01/24 11:28 PM  Result Value Ref Range   Sodium 138 135 - 145 mmol/L   Potassium 2.7 (L) 3.5 - 5.0 mmol/L   Chloride 104 98 - 107 mmol/L   CO2 25.4 21.0 - 32.0 mmol/L   Anion Gap 9 3 - 11 mmol/L   BUN 4 (L) 8 - 20 mg/dL   Creatinine 9.48 (L) 9.39 - 1.10 mg/dL   BUN/Creatinine Ratio 8    eGFR CKD-EPI (2021) Female >90 >=60 mL/min/1.64m2   Glucose 107 70 - 179 mg/dL   Calcium 8.9 8.5 - 89.8 mg/dL   Albumin 2.7 (L) 3.5 - 5.0 g/dL   Total Protein 6.9 6.0 - 8.0 g/dL   Total Bilirubin 0.3 0.3 - 1.2 mg/dL   AST 10 (L) 15 - 40 U/L   ALT 15 12 - 78 U/L   Alkaline Phosphatase 59 46 - 116 U/L  CBC w/ Differential   Collection Time: 03/01/24 11:28 PM  Result Value Ref Range   WBC 7.8 4.0 - 10.5 10*9/L   RBC 3.44 (L) 3.80 - 5.10 10*12/L   HGB 10.9 (L) 11.5 - 15.0 g/dL   HCT 70.1 (L) 65.9 - 55.9 %   MCV 86.6 80.0 - 98.0 fL   MCH 31.7 27.0 - 34.0 pg   MCHC 36.6 (H) 32.0 - 36.0 g/dL   RDW 85.7 88.4 - 85.4 %   MPV 9.2 7.4 - 10.4 fL   Platelet 287 140 - 415 10*9/L   Neutrophils % 64.1 %   Lymphocytes % 25.3 %   Monocytes % 9.0 %   Eosinophils % 0.9 %   Basophils % 0.4 %   Absolute Neutrophils 5.0 1.8 - 7.8 10*9/L   Absolute Lymphocytes 2.0 0.7 - 4.5 10*9/L   Absolute Monocytes 0.7 0.1 - 1.0 10*9/L   Absolute Eosinophils 0.1 0.0 - 0.4 10*9/L   Absolute Basophils 0.0 0.0 - 0.2 10*9/L  Type and Screen with Confirmation ABORh   Collection Time: 03/02/24 12:41 AM  Result Value Ref Range   Blood Type O POS    Antibody Screen NEG   Urinalysis with Microscopy  with Culture Reflex   Collection Time: 03/02/24  2:07 AM  Result Value Ref Range   Color, UA Yellow    Clarity, UA Cloudy (A) Clear   Specific Gravity, UA 1.019 1.010 - 1.025   pH, UA 6.5 5.0 - 8.0   Leukocyte Esterase, UA Large (A) Negative   Nitrite, UA Negative Negative   Protein, UA 30 mg/dL (A) Negative   Glucose, UA Negative Negative, Trace   Ketones, UA Negative Negative   Urobilinogen, UA <2.0 mg/dL <7.9 mg/dL   Bilirubin, UA Negative Negative   Blood, UA Trace (A) Negative   RBC, UA 17 (H) 0 - 3 /HPF  WBC, UA 92 (H) 0 - 3 /HPF   Squam Epithel, UA 23 (H) 0 - 10 /HPF   Bacteria, UA Occasional (A) None Seen /HPF   WBC Clumps None Seen None Seen /HPF   Hyphal Yeast None Seen None Seen /HPF   Sperm, UA Present /HPF   Yeast, UA Moderate (A) None Seen /HPF  Vaginitis Molecular Panel   Collection Time: 03/02/24  2:13 AM   Specimen: Patient-collected Vaginal Swab  Result Value Ref Range   Bacterial Vaginitis Positive (A) Negative   Candida group NAAT Detected (A) Not Detected   Candida glabrata/Candida krusei NAAT Detected (A) Not Detected   Trichomonas NAAT Not Detected Not Detected  POCT ROM   Collection Time: 03/02/24  8:00 AM  Result Value Ref Range   POCT ROM      Negative: a negative result is indicative of the absence of amniotic fluid   Internal QC, POCT ROM QC Acceptable    Kit Lot Number, POCT ROM [id number]    Kit Expiration Date, POCT ROM 04/08/2026                [1] Current Facility-Administered Medications  Medication Dose Route Frequency Provider Last Rate Last Admin   potassium chloride  10 mEq in 100 mL IVPB  10 mEq Intravenous Q1H Wacks, Margot Alexandra, DO      [2] Past Medical History: Diagnosis Date   Anxiety    Depression    Gestational diabetes (HHS-HCC)   [3] Past Surgical History: Procedure Laterality Date   OVARIAN CYST REMOVAL Left 03/04/2023  [4] Social History Socioeconomic History   Marital status: Single  Tobacco  Use   Smoking status: Some Days    Types: e-Cigarettes   Smokeless tobacco: Never  Vaping Use   Vaping status: Every Day   Substances: Nicotine  Substance and Sexual Activity   Alcohol use: Never   Drug use: Never   Sexual activity: Yes    Partners: Male   Social Drivers of Health   Food Insecurity: No Food Insecurity (02/01/2024)   Received from [facility]   Hunger Vital Sign    Within the past 12 months, you worried that your food would run out before you got the money to buy more.: Never true    Within the past 12 months, the food you bought just didn't last and you didn't have money to get more.: Never true  Tobacco Use: Medium Risk (02/23/2024)   Received from [facility]   Patient History    Smoking Tobacco Use: Passive Smoke Exposure - Never Smoker    Smokeless Tobacco Use: Never    Passive Exposure: Yes  Transportation Needs: No Transportation Needs (02/01/2024)   Received from [facility] - Transportation    In the past 12 months, has lack of transportation kept you from medical appointments or from getting medications?: No    In the past 12 months, has lack of transportation kept you from meetings, work, or from getting things needed for daily living?: No  Alcohol Use: Not At Risk (12/14/2023)   Received from [facility]   AUDIT-C    Q1: How often do you have a drink containing alcohol?: 2-3 times a week    Q2: How many drinks containing alcohol do you have on a typical day when you are drinking?: 1 or 2    Q3: How often do you have six or more drinks on one occasion?: Never  Housing: Unknown (12/14/2023)  Received from [facility]   Housing Stability Vital Sign    At any time in the past 12 months, were you homeless or living in a shelter (including now)?: No  Physical Activity: Insufficiently Active (02/01/2024)   Received from [facility]   Exercise Vital Sign    On average, how many days per week  do you engage in moderate to strenuous exercise (like a brisk walk)?: 3 days    On average, how many minutes do you engage in exercise at this level?: 30 min  Utilities: Not At Risk (02/01/2024)   Received from [facility] Utilities    In the past 12 months has the electric, gas, oil, or water company threatened to shut off services in your home?: No  Stress: Stress Concern Present (02/01/2024)   Received from [facility] - Occupational Stress Questionnaire    Do you feel stress - tense, restless, nervous, or anxious, or unable to sleep at night because your mind is troubled all the time - these days?: Rather much  Interpersonal Safety: Not At Risk (09/24/2023)   Interpersonal Safety    Unsafe Where You Currently Live: No    Physically Hurt by Anyone: No    Abused by Anyone: No  Substance Use: Low Risk (09/24/2023)   Substance Use    In the past year, how often have you used prescription drugs for non-medical reasons?: Never    In the past year, how often have you used illegal drugs?: Never    In the past year, have you used any substance for non-medical reasons?: No  Social Connections: Socially Isolated (02/01/2024)   Received from [facility]   Social Connection and Isolation Panel    In a typical week, how many times do you talk on the phone with family, friends, or neighbors?: More than three times a week    How often do you get together with friends or relatives?: Once a week    How often do you attend church or religious services?: Never    Do you belong to any clubs or organizations such as church groups, unions, fraternal or athletic groups, or school groups?: No    How often do you attend meetings of the clubs or organizations you belong to?: Never    Are you married, widowed, divorced, separated, never married, or living with a partner?: Never married  Physicist, Medical Strain: Low Risk (02/01/2024)   Received from  [facility]   Overall Financial Resource Strain (CARDIA)    How hard is it for you to pay for the very basics like food, housing, medical care, and heating?: Not hard at all  Health Literacy: Adequate Health Literacy (02/01/2024)   Received from [facility]   B1300 Health Literacy    How often do you need to have someone help you when you read instructions, pamphlets, or other written material from your doctor or pharmacy?: Never  Internet Connectivity: No Internet connectivity concern identified (09/24/2023)   Internet Connectivity    Do you have access to internet services: Yes    How do you connect to the internet: Personal Device at home    Is your internet connection strong enough for you to watch video on your device without major problems?: Yes    Do you have enough data to get through the month?: Yes    Does at least one of the devices have a camera that you  can use for video chat?: Yes

## 2024-03-02 NOTE — Discharge Summary (Signed)
" °  Admit Date: 03/01/2024  EDD: 08/13/2024, by Ultrasound  Discharge Date and Time: 03/02/24  Discharge to: Home  Discharge Attending Physician: Albesa Lorane Sarah, DO   Gestational Age at Delivery: 16 weeks 4 days  Condition at Discharge: good  Immunizations:  Immunization History  Administered Date(s) Administered   DTaP / Hep B / IPV (Pediarix) 03/11/2004   DTaP, Unspecified Formulation 11/01/2003, 01/07/2004, 02/26/2005, 10/05/2007   Hepatitis A Vaccine - Unspecified Formulation 10/05/2007   Hepatitis A Vaccine Pediatric / Adolescent 2 Dose IM 08/27/2009   Hepatitis B Vaccine, Unspecified Formulation Jun 10, 2003, 09/30/2003, 07/10/2004   Hepatitis B vaccine, pediatric/adolescent dosage, Jul 17, 2003   HiB, unspecified 11/01/2003, 01/07/2004, 03/11/2004, 02/26/2005   Human Papillomavirus Vaccine,9-Valent(PF) 09/19/2014, 01/20/2016   INFLUENZA TIV (TRI) 33MO+ W/ PRESERV (IM) 05/11/2011, 06/23/2011   Influenza Vaccine Quad(IM)6 MO-Adult(PF) 01/05/2019   MENINGOCOCCAL VACCINE, A,C,Y, W-135(IM)(MENVEO) 12/17/2020   MMR 10/07/2004, 10/05/2007   Meningococcal Conjugate MCV4P 09/19/2014   Pneumococcal conjugate -PCV7 11/01/2003, 01/07/2004, 03/11/2004, 02/26/2005   Polio Virus Vaccine, Unspecified Formulation 11/01/2003, 01/07/2004, 07/10/2004, 10/05/2007   TdaP 09/19/2014, 08/05/2022   Varicella 10/07/2004, 10/05/2007    Discharge Medications:   Medication List    START taking these medications    cephalexin  500 MG capsule; Commonly known as: KEFLEX ; Take 1 capsule  (500 mg total) by mouth four (4) times a day for 10 days.  ferrous sulfate 325 (65 FE) MG EC tablet  metroNIDAZOLE 500 MG tablet; Commonly known as: FLAGYL; Take 1 tablet  (500 mg total) by mouth two (2) times a day for 7 days.  multivitamin, prenatal (folic acid-iron) 29 mg iron- 1 mg; Take 1 tablet  by mouth daily.   CONTINUE taking these medications    escitalopram  oxalate 10 MG  tablet; Commonly known as: LEXAPRO   hydrOXYzine  10 MG tablet; Commonly known as: ATARAX    STOP taking these medications    ibuprofen  800 MG tablet; Commonly known as: MOTRIN   PRENATAL VITAMIN 27 mg iron- 0.8 mg Tab tablet; Generic drug: prenatal  vit no.130-iron-folic  promethazine  25 MG tablet; Commonly known as: PHENERGAN     Discharge Instructions:         "

## 2024-03-02 NOTE — Nursing Note (Signed)
 In patients chart for telemetry purposes

## 2024-03-23 ENCOUNTER — Other Ambulatory Visit: Payer: Self-pay | Admitting: Obstetrics & Gynecology

## 2024-03-23 DIAGNOSIS — Z363 Encounter for antenatal screening for malformations: Secondary | ICD-10-CM

## 2024-03-26 DIAGNOSIS — Z3A20 20 weeks gestation of pregnancy: Secondary | ICD-10-CM

## 2024-03-26 DIAGNOSIS — Z8759 Personal history of other complications of pregnancy, childbirth and the puerperium: Secondary | ICD-10-CM

## 2024-03-26 DIAGNOSIS — Z363 Encounter for antenatal screening for malformations: Secondary | ICD-10-CM

## 2024-03-27 ENCOUNTER — Ambulatory Visit (INDEPENDENT_AMBULATORY_CARE_PROVIDER_SITE_OTHER)

## 2024-03-27 ENCOUNTER — Ambulatory Visit (INDEPENDENT_AMBULATORY_CARE_PROVIDER_SITE_OTHER): Admitting: Women's Health

## 2024-03-27 ENCOUNTER — Encounter: Payer: Self-pay | Admitting: Women's Health

## 2024-03-27 VITALS — BP 123/76 | HR 78 | Wt 141.8 lb

## 2024-03-27 DIAGNOSIS — Z3A20 20 weeks gestation of pregnancy: Secondary | ICD-10-CM | POA: Diagnosis not present

## 2024-03-27 DIAGNOSIS — O2343 Unspecified infection of urinary tract in pregnancy, third trimester: Secondary | ICD-10-CM | POA: Diagnosis not present

## 2024-03-27 DIAGNOSIS — F32A Depression, unspecified: Secondary | ICD-10-CM

## 2024-03-27 DIAGNOSIS — Z8759 Personal history of other complications of pregnancy, childbirth and the puerperium: Secondary | ICD-10-CM

## 2024-03-27 DIAGNOSIS — Z79899 Other long term (current) drug therapy: Secondary | ICD-10-CM | POA: Diagnosis not present

## 2024-03-27 DIAGNOSIS — Z3480 Encounter for supervision of other normal pregnancy, unspecified trimester: Secondary | ICD-10-CM

## 2024-03-27 DIAGNOSIS — O99282 Endocrine, nutritional and metabolic diseases complicating pregnancy, second trimester: Secondary | ICD-10-CM

## 2024-03-27 DIAGNOSIS — E876 Hypokalemia: Secondary | ICD-10-CM

## 2024-03-27 DIAGNOSIS — O99342 Other mental disorders complicating pregnancy, second trimester: Secondary | ICD-10-CM | POA: Diagnosis not present

## 2024-03-27 DIAGNOSIS — Z87440 Personal history of urinary (tract) infections: Secondary | ICD-10-CM

## 2024-03-27 DIAGNOSIS — O2342 Unspecified infection of urinary tract in pregnancy, second trimester: Secondary | ICD-10-CM

## 2024-03-27 DIAGNOSIS — F418 Other specified anxiety disorders: Secondary | ICD-10-CM

## 2024-03-27 DIAGNOSIS — Z1379 Encounter for other screening for genetic and chromosomal anomalies: Secondary | ICD-10-CM

## 2024-03-27 DIAGNOSIS — Z363 Encounter for antenatal screening for malformations: Secondary | ICD-10-CM | POA: Diagnosis not present

## 2024-03-27 DIAGNOSIS — O2340 Unspecified infection of urinary tract in pregnancy, unspecified trimester: Secondary | ICD-10-CM | POA: Insufficient documentation

## 2024-03-27 DIAGNOSIS — Z1389 Encounter for screening for other disorder: Secondary | ICD-10-CM

## 2024-03-27 DIAGNOSIS — O0990 Supervision of high risk pregnancy, unspecified, unspecified trimester: Secondary | ICD-10-CM

## 2024-03-27 DIAGNOSIS — Z331 Pregnant state, incidental: Secondary | ICD-10-CM

## 2024-03-27 DIAGNOSIS — O0992 Supervision of high risk pregnancy, unspecified, second trimester: Secondary | ICD-10-CM

## 2024-03-27 LAB — POCT URINALYSIS DIPSTICK OB
Glucose, UA: NEGATIVE
Ketones, UA: NEGATIVE

## 2024-03-27 MED ORDER — HYDROXYZINE HCL 10 MG PO TABS: 10.0000 mg | ORAL_TABLET | Freq: Every day | ORAL | 4 refills | Status: AC | PRN

## 2024-03-27 MED ORDER — NITROFURANTOIN MONOHYD MACRO 100 MG PO CAPS: 100.0000 mg | ORAL_CAPSULE | Freq: Two times a day (BID) | ORAL | 0 refills | Status: DC

## 2024-03-27 NOTE — Patient Instructions (Signed)
 Michelle Prince, thank you for choosing our office today! We appreciate the opportunity to meet your healthcare needs. You may receive a short survey by mail, e-mail, or through [employer]. If you are happy with your care we would appreciate if you could take just a few minutes to complete the survey questions. We read all of your comments and take your feedback very seriously. Thank you again for choosing our office.  Center for [employer] Team at [facility] at [facility] ([address] [place], [place] [address]) Entrance C, located off of [address] Free 24/7 valet parking  Go to [email or web address] to register for FREE online childbirth classes  Call the office [phone]) or go to [facility] if: You begin to severe cramping Your water breaks.  Sometimes it is a big gush of fluid, sometimes it is just a trickle that keeps getting your panties wet or running down your legs You have vaginal bleeding.  It is normal to have a small amount of spotting if your cervix was checked.   [facility] Pediatricians/Family Doctors [facility]): [address] Dr. Luba BROCKS, [phone]           [facility]: [address], [phone]                [facility] Medicine [facility]): [address], [phone] (call to ask if accepting patients) [facility] Department: [address], [place], [phone]    [facility] Pediatricians/Family Doctors [facility]): [phone] [address], [phone] [facility]: [address] [place], [phone] [facility]: [address], [phone]  [facility] Doctors  [facility]): [phone] [facility]: [address], [phone]   [facility] Doctors [facility]: [address] [phone]  [facility] Doctors  [facility]: [phone], [phone]  Home Blood Pressure Monitoring for Patients   Your provider has recommended that you check your blood pressure (BP) at least once a week at home. If you do not have a blood pressure cuff at home, one will be provided for you. Contact your provider if you have not received your monitor within 1 week.   Helpful Tips for Accurate Home Blood Pressure Checks  Don't smoke, exercise, or drink caffeine 30 minutes before checking your BP Use the restroom before checking your BP (a full bladder can raise your pressure) Relax in a comfortable upright chair Feet on the ground Left arm resting comfortably on a flat surface at the level of your heart Legs uncrossed Back supported Sit quietly and don't talk Place the cuff on your bare arm Adjust snuggly, so that only two fingertips can fit between your skin and the top of the cuff Check 2 readings separated by at least one minute Keep a log of your BP readings For a visual, please reference this diagram: http://ccnc.care/bpdiagram  Provider Name: [facility]     Phone: [phone]  Zone 1: ALL CLEAR  Continue to monitor your symptoms:  BP reading is less than 140 (top number) or less than 90 (bottom number)  No right upper stomach pain No headaches or seeing spots No feeling nauseated or throwing up No swelling in face and hands  Zone 2: CAUTION Call your doctor's office for any of the following:  BP reading is greater than 140 (top number) or greater than  90 (bottom number)  Stomach pain under your ribs in the middle or right side Headaches or seeing spots Feeling nauseated or throwing up Swelling in face and hands  Zone 3: EMERGENCY  Seek immediate medical care if you have any of the following:  BP reading is greater than160 (top number) or greater than 110 (bottom number) Severe headaches not improving with Tylenol  Serious difficulty catching your breath Any worsening symptoms from Zone 2      Second Trimester of Pregnancy The second trimester is from week 14 through week 27 (months 4 through 6). The second trimester is often a time when you feel your best. Your body has adjusted to being pregnant, and you begin to feel better physically. Usually, morning sickness has lessened or quit completely, you may have more energy, and you may have an increase in appetite. The second trimester is also a time when the fetus is growing rapidly. At the end of the sixth month, the fetus is about 9 inches long and weighs about 1 pounds. You will likely begin to feel the baby move (quickening) between 16 and 20 weeks of pregnancy. Body changes during your second trimester Your body continues to go through many changes during your second trimester. The changes vary from woman to woman. Your weight will continue to increase. You will notice your lower abdomen bulging out. You may begin to get stretch marks on your hips, abdomen, and breasts. You may develop headaches that can be relieved by medicines. The medicines should be approved by your health care provider. You may urinate more often because the fetus is pressing on your bladder. You may develop or continue to have heartburn as a result of your pregnancy. You may develop constipation because certain hormones are causing the muscles that push waste through your intestines to slow down. You may develop hemorrhoids or swollen, bulging veins (varicose veins). You may have back pain. This is caused by: Weight gain. Pregnancy hormones that are relaxing the joints in your pelvis. A shift in weight and the muscles that support your balance. Your breasts will continue to grow and they will continue to become tender. Your gums may bleed and may be sensitive to brushing and flossing. Dark spots or blotches (chloasma, mask of pregnancy) may develop on your face. This will likely fade after the baby is born. A dark line from your belly button to the  pubic area (linea nigra) may appear. This will likely fade after the baby is born. You may have changes in your hair. These can include thickening of your hair, rapid growth, and changes in texture. Some women also have hair loss during or after pregnancy, or hair that feels dry or thin. Your hair will most likely return to normal after your baby is born.  What to expect at prenatal visits During a routine prenatal visit: You will be weighed to make sure you and the fetus are growing normally. Your blood pressure will be taken. Your abdomen will be measured to track your baby's growth. The fetal heartbeat will be listened to. Any test results from the previous visit will be discussed.  Your health care provider may ask you: How you are feeling. If you are feeling the baby move. If you have had any abnormal symptoms, such as leaking fluid, bleeding, severe headaches, or abdominal cramping. If you are using any tobacco products, including cigarettes, chewing tobacco, and electronic cigarettes. If you have any questions.  Other tests that may be performed during  your second trimester include: Blood tests that check for: Low iron levels (anemia). High blood sugar that affects pregnant women (gestational diabetes) between 17 and 28 weeks. Rh antibodies. This is to check for a protein on red blood cells (Rh factor). Urine tests to check for infections, diabetes, or protein in the urine. An ultrasound to confirm the proper growth and development of the baby. An amniocentesis to check for possible genetic problems. Fetal screens for spina bifida and Down syndrome. HIV (human immunodeficiency virus) testing. Routine prenatal testing includes screening for HIV, unless you choose not to have this test.  Follow these instructions at home: Medicines Follow your health care provider's instructions regarding medicine use. Specific medicines may be either safe or unsafe to take during pregnancy. Take  a prenatal vitamin that contains at least 600 micrograms (mcg) of folic acid. If you develop constipation, try taking a stool softener if your health care provider approves. Eating and drinking Eat a balanced diet that includes fresh fruits and vegetables, whole grains, good sources of protein such as meat, eggs, or tofu, and low-fat dairy. Your health care provider will help you determine the amount of weight gain that is right for you. Avoid raw meat and uncooked cheese. These carry germs that can cause birth defects in the baby. If you have low calcium intake from food, talk to your health care provider about whether you should take a daily calcium supplement. Limit foods that are high in fat and processed sugars, such as fried and sweet foods. To prevent constipation: Drink enough fluid to keep your urine clear or pale yellow. Eat foods that are high in fiber, such as fresh fruits and vegetables, whole grains, and beans. Activity Exercise only as directed by your health care provider. Most women can continue their usual exercise routine during pregnancy. Try to exercise for 30 minutes at least 5 days a week. Stop exercising if you experience uterine contractions. Avoid heavy lifting, wear low heel shoes, and practice good posture. A sexual relationship may be continued unless your health care provider directs you otherwise. Relieving pain and discomfort Wear a good support bra to prevent discomfort from breast tenderness. Take warm sitz baths to soothe any pain or discomfort caused by hemorrhoids. Use hemorrhoid cream if your health care provider approves. Rest with your legs elevated if you have leg cramps or low back pain. If you develop varicose veins, wear support hose. Elevate your feet for 15 minutes, 3-4 times a day. Limit salt in your diet. Prenatal Care Write down your questions. Take them to your prenatal visits. Keep all your prenatal visits as told by your health care provider.  This is important. Safety Wear your seat belt at all times when driving. Make a list of emergency phone numbers, including numbers for family, friends, the hospital, and police and fire departments. General instructions Ask your health care provider for a referral to a local prenatal education class. Begin classes no later than the beginning of month 6 of your pregnancy. Ask for help if you have counseling or nutritional needs during pregnancy. Your health care provider can offer advice or refer you to specialists for help with various needs. Do not use hot tubs, steam rooms, or saunas. Do not douche or use tampons or scented sanitary pads. Do not cross your legs for long periods of time. Avoid cat litter boxes and soil used by cats. These carry germs that can cause birth defects in the baby and possibly loss of the  fetus by miscarriage or stillbirth. Avoid all smoking, herbs, alcohol, and unprescribed drugs. Chemicals in these products can affect the formation and growth of the baby. Do not use any products that contain nicotine or tobacco, such as cigarettes and e-cigarettes. If you need help quitting, ask your health care provider. Visit your dentist if you have not gone yet during your pregnancy. Use a soft toothbrush to brush your teeth and be gentle when you floss. Contact a health care provider if: You have dizziness. You have mild pelvic cramps, pelvic pressure, or nagging pain in the abdominal area. You have persistent nausea, vomiting, or diarrhea. You have a bad smelling vaginal discharge. You have pain when you urinate. Get help right away if: You have a fever. You are leaking fluid from your vagina. You have spotting or bleeding from your vagina. You have severe abdominal cramping or pain. You have rapid weight gain or weight loss. You have shortness of breath with chest pain. You notice sudden or extreme swelling of your face, hands, ankles, feet, or legs. You have not felt  your baby move in over an hour. You have severe headaches that do not go away when you take medicine. You have vision changes. Summary The second trimester is from week 14 through week 27 (months 4 through 6). It is also a time when the fetus is growing rapidly. Your body goes through many changes during pregnancy. The changes vary from woman to woman. Avoid all smoking, herbs, alcohol, and unprescribed drugs. These chemicals affect the formation and growth your baby. Do not use any tobacco products, such as cigarettes, chewing tobacco, and e-cigarettes. If you need help quitting, ask your health care provider. Contact your health care provider if you have any questions. Keep all prenatal visits as told by your health care provider. This is important. This information is not intended to replace advice given to you by your health care provider. Make sure you discuss any questions you have with your health care provider. Document Released: 03/02/2001 Document Revised: 08/14/2015 Document Reviewed: 05/09/2012 Elsevier Interactive Patient Education  2017 [employer].

## 2024-03-27 NOTE — Progress Notes (Signed)
 US  20+1 wks,breech,posterior placenta gr 0,normal ovaries,CX 3 cm,FHR 140 bpm,SVP of fluid 3.8 cm,EFW 301 g 19%,anatomy complete,no obvious abnormalities

## 2024-03-27 NOTE — Addendum Note (Signed)
 Addended by: KIZZIE IHA R on: 03/27/2024 12:20 PM   Modules accepted: Orders

## 2024-03-27 NOTE — Progress Notes (Addendum)
 "  HIGH-RISK PREGNANCY VISIT Patient name: Michelle Prince MRN [id number]  Date of birth: [birth year] Chief Complaint:   Routine Prenatal Visit and Pregnancy Ultrasound (AFP ,refill hydroxyzine )  History of Present Illness:   Michelle Prince is a 19 y.o. G58P0110 female at [redacted]w[redacted]d with an Estimated Date of Delivery: 08/13/24 being seen today for ongoing management of a high-risk pregnancy complicated by history of 36w intrauterine fetal demise.    Today she reports went to [facility], got IV K+, was told to have it rechecked. Also had UTI, completed keflex  rx, feels like not emptying bladder completely. Had constant dull lower abd pain last night. Doing well on lexapro /vistaril , needs refill on vistaril . Taking 1 daily. OK w/ IBH referral. Contractions: Not present.  .  Movement: Present. denies leaking of fluid.      02/01/2024    2:30 PM  Depression screen PHQ 2/9  Decreased Interest 1  Down, Depressed, Hopeless 1  PHQ - 2 Score 2  Altered sleeping 1  Tired, decreased energy 1  Change in appetite 1  Feeling bad or failure about yourself  1  Trouble concentrating 1  Moving slowly or fidgety/restless 0  Suicidal thoughts 0  PHQ-9 Score 7        02/01/2024    2:30 PM  GAD 7 : Generalized Anxiety Score  Nervous, Anxious, on Edge 2  Control/stop worrying 1  Worry too much - different things 2  Trouble relaxing 1  Restless 0  Easily annoyed or irritable 1  Afraid - awful might happen 2  Total GAD 7 Score 9     Review of Systems:   Pertinent items are noted in HPI Denies abnormal vaginal discharge w/ itching/odor/irritation, headaches, visual changes, shortness of breath, chest pain, abdominal pain, severe nausea/vomiting, or problems with urination or bowel movements unless otherwise stated above. Pertinent History Reviewed:  Reviewed past medical,surgical, social, obstetrical and family history.  Reviewed problem list, medications and allergies. Physical Assessment:   Vitals:    03/27/24 1043  BP: 123/76  Pulse: 78  Weight: 141 lb 12.8 oz (64.3 kg)  Body mass index is 25.12 kg/m.           Physical Examination:   General appearance: alert, well appearing, and in no distress  Mental status: alert, oriented to person, place, and time  Skin: warm & dry   Extremities: Edema: None    Cardiovascular: normal heart rate noted  Respiratory: normal respiratory effort, no distress  Abdomen: gravid, soft, non-tender  Pelvic: Cervical exam deferred         Fetal Status:     Movement: Present    Fetal Surveillance Testing today: US  20+1 wks,breech,posterior placenta gr 0,normal ovaries,CX 3 cm,FHR 140 bpm,SVP of fluid 3.8 cm,EFW 301 g 19%,anatomy complete,no obvious abnormalities   Chaperone: N/A  Results for orders placed or performed in visit on 03/27/24 (from the past 24 hours)  POC Urinalysis Dipstick OB   Collection Time: 03/27/24 11:20 AM  Result Value Ref Range   Color, UA     Clarity, UA     Glucose, UA Negative Negative   Bilirubin, UA     Ketones, UA negative    Spec Grav, UA     Blood, UA trace    pH, UA     POC,PROTEIN,UA Moderate (2+) Negative, Trace, Small (1+), Moderate (2+), Large (3+), 4+   Urobilinogen, UA     Nitrite, UA postive    Leukocytes, UA Small (1+) (A) Negative  Appearance     Odor      Assessment & Plan:  High-risk pregnancy: G3P0110 at [redacted]w[redacted]d with an Estimated Date of Delivery: 08/13/24   1) H/O 36w IUFD  2) Recent UTI w/ h/o pyelo/sepsis, dx @ [facility] no urine cx, finished keflex , doesn't feel emptying bladder and had low abd pain last night. No fever/chills. Send urine cx today. +nitrites on dipstick, rx macrobid   3) Hypokalemia> was 2.7 at recent [facility] visit, 3.4 s/p IV K+, check CMP today  4) Dep/anx> doing much better on lexapro /vistaril , refilled vistaril , IBH referral  Meds:  Meds ordered this encounter  Medications   hydrOXYzine  (ATARAX ) 10 MG tablet    Sig: Take 1 tablet (10 mg total) by mouth daily as needed for  anxiety.    Dispense:  30 tablet    Refill:  4    Labs/procedures today: U/S, urine culture, AFP, and CMP  Treatment Plan:  EFW q4w @ 24w     2x/wk nst or weekly bpp @ 32wks      Deliver @ 39wks (37wks if h/o abruption or >35w prior IUFD w/ severe maternal anxiety, or per MFM)   Reviewed: Preterm labor symptoms and general obstetric precautions including but not limited to vaginal bleeding, contractions, leaking of fluid and fetal movement were reviewed in detail with the patient.  All questions were answered. Does have home bp cuff. Office bp cuff given: not applicable. Check bp weekly, let us  know if consistently >140 and/or >90.  Follow-up: Return in about 4 weeks (around 04/24/2024) for HROB, US :EFW, MD or CNM, in person.   Future Appointments  Date Time Provider Department Center  04/24/2024  3:00 PM [facility] 2 [facility] None  04/24/2024  3:50 PM Kizzie, Suzen SAUNDERS, CNM [facility]    Orders Placed This Encounter  Procedures   Urine Culture   US  OB Follow Up   AFP, Serum, Open Spina Bifida   Comprehensive metabolic panel with GFR   Amb ref to [facility]   POC Urinalysis Dipstick OB   Suzen SAUNDERS Kizzie Ventana, [facility] 03/27/2024 12:17 PM  "

## 2024-03-28 ENCOUNTER — Telehealth: Payer: Self-pay

## 2024-03-28 NOTE — Telephone Encounter (Signed)
 Rn attempted to call patient on all four numbers listed on her account. Unable to leave VM on the first 3. Last number listed was answered and her mother picked up Rn relayed HIPAA compliant message that patient had medication that needed to be picked up at pharmacy.

## 2024-03-30 LAB — URINE CULTURE

## 2024-04-02 ENCOUNTER — Ambulatory Visit: Payer: Self-pay | Admitting: Women's Health

## 2024-04-02 ENCOUNTER — Telehealth: Payer: Self-pay

## 2024-04-02 MED ORDER — CEPHALEXIN 500 MG PO CAPS: 500.0000 mg | ORAL_CAPSULE | Freq: Four times a day (QID) | ORAL | 0 refills | Status: DC

## 2024-04-02 NOTE — Telephone Encounter (Signed)
 Patient called regarding a change in fetal movement since last night. Patient is 21 weeks today. RN explained that the baby is still small and it may be hard to feel every movement at this gestation. Advised to drink water, eat a snack and focus on movements. Educated on concerning bleeding, leaking of fluid or passing of clots. Advised to call or message office if other concerns arise or to go to [facility]U if after hours or weekend. Patient verbalized understanding and all questions answered.

## 2024-04-04 ENCOUNTER — Telehealth: Payer: Self-pay | Admitting: Clinical

## 2024-04-04 NOTE — Telephone Encounter (Signed)
 Attempt call regarding referral; Unable to reach  as call cannot be completed at this time.

## 2024-04-06 NOTE — Telephone Encounter (Signed)
 Unable to leave VM regarding recent urine culture.

## 2024-04-09 ENCOUNTER — Other Ambulatory Visit: Payer: Self-pay | Admitting: Women's Health

## 2024-04-12 ENCOUNTER — Encounter: Payer: Self-pay | Admitting: *Deleted

## 2024-04-12 ENCOUNTER — Ambulatory Visit

## 2024-04-19 ENCOUNTER — Other Ambulatory Visit: Payer: Self-pay | Admitting: Women's Health

## 2024-04-19 DIAGNOSIS — E876 Hypokalemia: Secondary | ICD-10-CM

## 2024-04-19 DIAGNOSIS — F419 Anxiety disorder, unspecified: Secondary | ICD-10-CM

## 2024-04-19 DIAGNOSIS — Z3A24 24 weeks gestation of pregnancy: Secondary | ICD-10-CM

## 2024-04-19 DIAGNOSIS — Z87440 Personal history of urinary (tract) infections: Secondary | ICD-10-CM

## 2024-04-19 DIAGNOSIS — Z3A20 20 weeks gestation of pregnancy: Secondary | ICD-10-CM

## 2024-04-19 DIAGNOSIS — F418 Other specified anxiety disorders: Secondary | ICD-10-CM

## 2024-04-19 DIAGNOSIS — O09299 Supervision of pregnancy with other poor reproductive or obstetric history, unspecified trimester: Secondary | ICD-10-CM

## 2024-04-19 DIAGNOSIS — O0990 Supervision of high risk pregnancy, unspecified, unspecified trimester: Secondary | ICD-10-CM

## 2024-04-19 DIAGNOSIS — Z1379 Encounter for other screening for genetic and chromosomal anomalies: Secondary | ICD-10-CM

## 2024-04-19 DIAGNOSIS — Z331 Pregnant state, incidental: Secondary | ICD-10-CM

## 2024-04-19 DIAGNOSIS — Z8759 Personal history of other complications of pregnancy, childbirth and the puerperium: Secondary | ICD-10-CM

## 2024-04-19 DIAGNOSIS — O2342 Unspecified infection of urinary tract in pregnancy, second trimester: Secondary | ICD-10-CM

## 2024-04-19 DIAGNOSIS — O0992 Supervision of high risk pregnancy, unspecified, second trimester: Secondary | ICD-10-CM

## 2024-04-19 DIAGNOSIS — Z1389 Encounter for screening for other disorder: Secondary | ICD-10-CM

## 2024-04-24 ENCOUNTER — Other Ambulatory Visit (HOSPITAL_COMMUNITY): Admission: RE | Admit: 2024-04-24 | Discharge: 2024-04-24 | Disposition: A | Source: Ambulatory Visit

## 2024-04-24 ENCOUNTER — Encounter: Payer: Self-pay | Admitting: Women's Health

## 2024-04-24 ENCOUNTER — Ambulatory Visit: Admitting: Radiology

## 2024-04-24 ENCOUNTER — Ambulatory Visit: Admitting: Women's Health

## 2024-04-24 VITALS — BP 132/75 | HR 96 | Wt 139.0 lb

## 2024-04-24 DIAGNOSIS — Z8759 Personal history of other complications of pregnancy, childbirth and the puerperium: Secondary | ICD-10-CM

## 2024-04-24 DIAGNOSIS — O99282 Endocrine, nutritional and metabolic diseases complicating pregnancy, second trimester: Secondary | ICD-10-CM

## 2024-04-24 DIAGNOSIS — O2342 Unspecified infection of urinary tract in pregnancy, second trimester: Secondary | ICD-10-CM | POA: Diagnosis not present

## 2024-04-24 DIAGNOSIS — O09891 Supervision of other high risk pregnancies, first trimester: Secondary | ICD-10-CM | POA: Diagnosis not present

## 2024-04-24 DIAGNOSIS — E876 Hypokalemia: Secondary | ICD-10-CM | POA: Diagnosis not present

## 2024-04-24 DIAGNOSIS — O09299 Supervision of pregnancy with other poor reproductive or obstetric history, unspecified trimester: Secondary | ICD-10-CM

## 2024-04-24 DIAGNOSIS — Z3A24 24 weeks gestation of pregnancy: Secondary | ICD-10-CM | POA: Diagnosis not present

## 2024-04-24 DIAGNOSIS — N898 Other specified noninflammatory disorders of vagina: Secondary | ICD-10-CM | POA: Diagnosis not present

## 2024-04-24 DIAGNOSIS — O0992 Supervision of high risk pregnancy, unspecified, second trimester: Secondary | ICD-10-CM

## 2024-04-24 DIAGNOSIS — O0990 Supervision of high risk pregnancy, unspecified, unspecified trimester: Secondary | ICD-10-CM

## 2024-04-24 MED ORDER — CEPHALEXIN 500 MG PO CAPS: 500.0000 mg | ORAL_CAPSULE | Freq: Every day | ORAL | 4 refills | Status: AC

## 2024-04-24 MED ORDER — CEFTRIAXONE SODIUM 1 G IJ SOLR
1.0000 g | Freq: Once | INTRAMUSCULAR | Status: AC
  Administered 2024-04-24: 1 g via INTRAMUSCULAR

## 2024-04-24 NOTE — Progress Notes (Signed)
 US : GA = 24+1 weeks Single active female fetus, cephalic, FHR = 157 bpm, posterior pl, gr0, SVP = 4.5 cm, EFW 28%, 633g, CL = 3.8 cm, closed, normal ov's

## 2024-04-26 ENCOUNTER — Ambulatory Visit: Payer: Self-pay | Admitting: Women's Health

## 2024-04-26 ENCOUNTER — Telehealth: Payer: Self-pay

## 2024-04-26 LAB — CERVICOVAGINAL ANCILLARY ONLY
Bacterial Vaginitis (gardnerella): POSITIVE — AB
Candida Glabrata: NEGATIVE
Candida Vaginitis: POSITIVE — AB
Chlamydia: NEGATIVE
Comment: NEGATIVE
Comment: NEGATIVE
Comment: NEGATIVE
Comment: NEGATIVE
Comment: NEGATIVE
Comment: NORMAL
Neisseria Gonorrhea: NEGATIVE
Trichomonas: NEGATIVE

## 2024-04-26 NOTE — Telephone Encounter (Signed)
 Patient called after having a SA overdose through pills took 10 hydroxyzine  and 2 benadryl 's. Called emergency services herself. Currently admitted [facility] in [place]. Wants to be at [facility] and not in [place]. RN to discuss with MD to determine if it will be more beneficial for her to stay where she is at or to be in a [facility] facility. Per MD [facility] does not have a facility that has the resources like the one she is at but she will inquire about if it would be possible to transfer. Patient understanding.

## 2024-04-26 NOTE — Telephone Encounter (Signed)
 ------------------------------------------------------------------------------- Summary: Late entry -------------------------------------------------------------------------------   10:01 PM  Phone call from nurse station 5 relayed to me during code green about a consult.  Caller informed by OR nurse that I was unavailable.   10:42 PM  Call from Dr. Rudolph while still engaged in emergency surgery.  He presented the following information: EGA [redacted] weeks, IVC, looking for guidance on safe sedation options.  I personally spoke with Dr. Rudolph and explained that I was involved in an emergency surgery.  Recommended that he call maternal-fetal medicine team for further guidance.   12:02 AM  I called Dr. Rudolph to get more information about the consultation.  After clarification of the need, agreed on fetal heart tracing while in the main ED.  This was communicated to Geni PEAK, L&D charge nurse.   12:32 AM  Notified by Dr. Rudolph of newly diagnosed hypokalemia.  Requesting admission to [facility] hospitalist service for observation while receiving electrolyte replacement.  Recommended that he contact the [facility] transfer center to have Ms. Garden moved to a facility with the appropriate NICU status for her gestational age.  Offered to engage in a team-based conversation with providers at the potential receiving facility.   04/25/2024 2225 04/25/2024 2230 Most Recent Value    BP: 117/69 105/65 105/66 105/66  as of 04/25/2024  Height: -- -- -- 160 cm (5' 3)  as of 03/01/2024  Weight: -- -- -- 64 kg (141 lb)  as of 03/01/2024  Body Mass Index:    24.98 kg/m 160 cm (5' 3)  as of 03/01/2024 64 kg (141 lb)  as of 03/01/2024  Pulse: 88 63 76 76  as of 04/25/2024  Resp: 16 18 -- 18  as of 04/25/2024  Temp: 36.7 C (98 F) 36.7 C (98 F) -- 36.7 C (98 F)  as of 04/25/2024  SpO2: 100 % 98 % 97 % 97%  as of 04/25/2024  Peak Flow: -- -- -- Not taken  LMP:    08/25/2023 (Exact Date)  Body Surface Area:    1.69  m 160 cm (5' 3)  as of 03/01/2024 64 kg (141 lb)  as of 03/01/2024  Adjusted Weight:    57 kg (125 lb 11.4 oz) Actual Weight: 64 kg (141 lb)  as of 03/01/2024 Ideal Weight: 52.4 kg (115 lb 8.3 oz)  as of 03/01/2024  Dosing Weight:    None  Ideal Weight:    52.4 kg (115 lb 8.3 oz) 160 cm (5' 3)  as of 03/01/2024     Latest Reference Range & Units 04/25/24 23:40 04/25/24 23:48  WBC 4.2 - 10.2 10*9/L 6.9   HGB 11.3 - 14.9 g/dL 89.7 (L)   HCT 65.9 - 55.9 % 29.3 (L)   Sodium 136 - 145 mmol/L 142   Potassium 3.5 - 5.1 mmol/L 2.5 (LL)   Chloride 98 - 107 mmol/L 108 (H)   Creatinine 0.55 - 1.02 mg/dL 9.47 (L)   Glucose 70 - 179 mg/dL 59 (L)   Calcium 8.7 - 10.4 mg/dL 8.5 (L)   Albumin 3.4 - 5.0 g/dL 2.8 (L)   HCG Urine, POC Negative, Invalid   Positive !  TSH 0.550 - 4.780 uIU/mL 1.550   Acetaminophen  Level 10.0 - 20.0 ug/ml <2.0 (L)   Alcohol, Ethyl <=10 mg/dL <89   Salicylate Lvl 0.0 - 30.0 mg/dL <3.0   (LL): Data is critically low (L): Data is abnormally low (H): Data is abnormally high !: Data is abnormal close Dr. Allena yes  ma'am

## 2024-04-27 NOTE — Telephone Encounter (Signed)
 Made contact with [facility] on [facility] at [facility] regarding CV swab that was collected on 2/3. Informed patient will need treatment if provider there can treat.  States she will contact provider as patient is being transferred to another facility any time now.

## 2024-05-22 ENCOUNTER — Other Ambulatory Visit

## 2024-05-22 ENCOUNTER — Encounter: Admitting: Obstetrics & Gynecology

## 2024-05-22 ENCOUNTER — Encounter: Admitting: Women's Health
